# Patient Record
Sex: Male | Born: 1945 | Race: White | Hispanic: No | Marital: Single | Smoking: Never smoker
Health system: Southern US, Community
[De-identification: ages and names within clinical notes are randomized; demographics above are authoritative.]

## PROBLEM LIST (undated history)

## (undated) DIAGNOSIS — M24661 Ankylosis, right knee: Secondary | ICD-10-CM

## (undated) DIAGNOSIS — M1990 Unspecified osteoarthritis, unspecified site: Secondary | ICD-10-CM

## (undated) DIAGNOSIS — I10 Essential (primary) hypertension: Secondary | ICD-10-CM

## (undated) DIAGNOSIS — C801 Malignant (primary) neoplasm, unspecified: Secondary | ICD-10-CM

## (undated) DIAGNOSIS — R0981 Nasal congestion: Secondary | ICD-10-CM

## (undated) HISTORY — PX: KNEE ARTHROSCOPY: SUR90

---

## 2016-08-03 ENCOUNTER — Ambulatory Visit (INDEPENDENT_AMBULATORY_CARE_PROVIDER_SITE_OTHER): Payer: Worker's Compensation | Admitting: Orthopaedic Surgery

## 2016-08-03 DIAGNOSIS — M1712 Unilateral primary osteoarthritis, left knee: Secondary | ICD-10-CM

## 2016-08-03 DIAGNOSIS — G8929 Other chronic pain: Secondary | ICD-10-CM | POA: Insufficient documentation

## 2016-08-03 DIAGNOSIS — M25561 Pain in right knee: Secondary | ICD-10-CM | POA: Diagnosis not present

## 2016-08-03 MED ORDER — METHYLPREDNISOLONE ACETATE 40 MG/ML IJ SUSP
40.0000 mg | INTRAMUSCULAR | Status: AC | PRN
  Administered 2016-08-03: 40 mg via INTRA_ARTICULAR

## 2016-08-03 MED ORDER — LIDOCAINE HCL 1 % IJ SOLN
3.0000 mL | INTRAMUSCULAR | Status: AC | PRN
  Administered 2016-08-03: 3 mL

## 2016-08-03 NOTE — Progress Notes (Signed)
   Office Visit Note   Patient: Joseph Craig           Date of Birth: Apr 10, 1946           MRN: [id number] Visit Date: 08/03/2016              Requested by: No referring provider defined for this encounter. PCP: No PCP Per Patient   Assessment & Plan: Visit Diagnoses:  1. Chronic pain of right knee   2. Unilateral primary osteoarthritis, left knee     Plan: He tolerated the steroid injection well and is right knee. I'll see him back in a month and see if we can consider hyaluronic acid again after that point. 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  Follow-Up Instructions: Return in about 4 weeks (around 08/31/2016).   Orders:  No orders of the defined types were placed in this encounter.  No orders of the defined types were placed in this encounter.     Procedures: Large Joint Inj Date/Time: 08/03/2016 3:59 PM Performed by: Mcarthur Rossetti Authorized by: Mcarthur Rossetti   Location:  Knee Site:  R knee Ultrasound Guidance: No   Fluoroscopic Guidance: No   Arthrogram: No   Medications:  3 mL lidocaine 1 %; 40 mg methylPREDNISolone acetate 40 MG/ML     Clinical Data: No additional findings.   Subjective: No chief complaint on file. The patient is very pleasant 71 year old well known to me. We follow him routinely for a work-related injury to his right knee. He had a twisting injury that knee and sustained a meniscal tear but he also has known pre-existing arthritis in that knee. His injury did aggravate a pre-existing condition. He has done well with supplement hyaluronic acid injections in his knee. We last in 15 months ago Lattie Haw best done great however treatment started to wear off file to consider steroid injection is needed today.    HPI  Review of Systems   Objective: Vital Signs: There were no vitals taken for this visit.  Physical Exam He is alert and oriented 3 in no  acute distress and walks with just a slight limp Ortho Exam His right knee is ligamentously stable. It has a mild varus malalignment. Good range of motion but definitely hurts on the medial joint line. Specialty Comments:  No specialty comments available.  Imaging: No results found.   PMFS History: Patient Active Problem List   Diagnosis Date Noted  . Unilateral primary osteoarthritis, left knee 08/03/2016  . Chronic pain of right knee 08/03/2016   No past medical history on file.  No family history on file.  No past surgical history on file. Social History   Occupational History  . Not on file.   Social History Main Topics  . Smoking status: Not on file  . Smokeless tobacco: Not on file  . Alcohol use Not on file  . Drug use: Unknown  . Sexual activity: Not on file

## 2016-08-31 ENCOUNTER — Telehealth (INDEPENDENT_AMBULATORY_CARE_PROVIDER_SITE_OTHER): Payer: Self-pay

## 2016-08-31 ENCOUNTER — Ambulatory Visit (INDEPENDENT_AMBULATORY_CARE_PROVIDER_SITE_OTHER): Payer: Worker's Compensation | Admitting: Orthopaedic Surgery

## 2016-08-31 DIAGNOSIS — M25561 Pain in right knee: Secondary | ICD-10-CM

## 2016-08-31 DIAGNOSIS — G8929 Other chronic pain: Secondary | ICD-10-CM

## 2016-08-31 DIAGNOSIS — M1712 Unilateral primary osteoarthritis, left knee: Secondary | ICD-10-CM

## 2016-08-31 NOTE — Telephone Encounter (Signed)
Is there anyway we can have a STAT request for Monovisc injection?

## 2016-08-31 NOTE — Progress Notes (Signed)
The patient has known arthritic changes in his right knee that flared up after work-related accident. He is here today for scheduled hyaluronic acid injection in his right knee. That's what's helped him the most he states. I still feel at some point he would end up with an arthroscopic intervention or even a knee replacement lower try to still temporize things and that's what he wants as well. We have ordered the Monvisc injection for his right knee and is here for it today. He is doing well otherwise other than chronic right knee pain. He said the last steroid injection didn't help much.  On examination of his right knee has a slight varus deformity with good range of motion but is just painful to him.  He tolerated the hyaluronic acid injection well. We'll see him back in about 3 months to see how is doing overall. My next step with him would be an arthroscopic intervention.

## 2016-08-31 NOTE — Telephone Encounter (Signed)
lvm for adj Joseph Craig to return my call regarding auth

## 2016-09-23 ENCOUNTER — Telehealth (INDEPENDENT_AMBULATORY_CARE_PROVIDER_SITE_OTHER): Payer: Self-pay

## 2016-09-23 NOTE — Telephone Encounter (Signed)
Faxed the 08/31/16 and 08/03/16 office notes to him per his request [phone]

## 2016-10-14 ENCOUNTER — Ambulatory Visit (INDEPENDENT_AMBULATORY_CARE_PROVIDER_SITE_OTHER): Payer: Worker's Compensation | Admitting: Physician Assistant

## 2016-10-14 DIAGNOSIS — M1711 Unilateral primary osteoarthritis, right knee: Secondary | ICD-10-CM | POA: Diagnosis not present

## 2016-10-14 MED ORDER — METHYLPREDNISOLONE ACETATE 40 MG/ML IJ SUSP
40.0000 mg | INTRAMUSCULAR | Status: AC | PRN
  Administered 2016-10-14: 40 mg via INTRA_ARTICULAR

## 2016-10-14 MED ORDER — LIDOCAINE HCL 1 % IJ SOLN
3.0000 mL | INTRAMUSCULAR | Status: AC | PRN
  Administered 2016-10-14: 3 mL

## 2016-10-14 NOTE — Progress Notes (Signed)
   Procedure Note [name] comes in today requesting a cortisone injection in his right knee. He states that the Monovisc injection has not helped. Monovisc injection given on 08/31/2016. He is having pain walking up and down stairs. No new injury.   Patient: Joseph Craig             Date of Birth: [birth year]           MRN: [id number]             Visit Date: 10/14/2016  Procedures: Visit Diagnoses: Primary osteoarthritis of right knee  Large Joint Inj Date/Time: 10/14/2016 11:25 AM Performed by: Pete Pelt Authorized by: Pete Pelt   Location:  Knee Site:  R knee Needle Size:  22 G Needle Length:  1.5 inches and 3.5 inches Approach:  Anterolateral Ultrasound Guidance: No   Fluoroscopic Guidance: No   Arthrogram: No   Medications:  3 mL lidocaine 1 %; 40 mg methylPREDNISolone acetate 40 MG/ML Aspiration Attempted: No   Patient tolerance:  Patient tolerated the procedure well with no immediate complications   Plan: They did have slight effusion of the knee today I suggested aspiration he defers. He will follow with Dr. Ninfa Linden as scheduled on June 21.

## 2016-12-01 ENCOUNTER — Encounter (INDEPENDENT_AMBULATORY_CARE_PROVIDER_SITE_OTHER): Payer: Self-pay | Admitting: Orthopaedic Surgery

## 2016-12-01 ENCOUNTER — Ambulatory Visit (INDEPENDENT_AMBULATORY_CARE_PROVIDER_SITE_OTHER): Payer: Worker's Compensation | Admitting: Orthopaedic Surgery

## 2016-12-01 DIAGNOSIS — G8929 Other chronic pain: Secondary | ICD-10-CM | POA: Diagnosis not present

## 2016-12-01 DIAGNOSIS — M25561 Pain in right knee: Secondary | ICD-10-CM | POA: Diagnosis not present

## 2016-12-01 DIAGNOSIS — M1711 Unilateral primary osteoarthritis, right knee: Secondary | ICD-10-CM | POA: Diagnosis not present

## 2016-12-01 DIAGNOSIS — M23203 Derangement of unspecified medial meniscus due to old tear or injury, right knee: Secondary | ICD-10-CM | POA: Diagnosis not present

## 2016-12-01 MED ORDER — MELOXICAM 7.5 MG PO TABS: 7.5000 mg | ORAL_TABLET | Freq: Two times a day (BID) | ORAL | 3 refills | Status: DC | PRN

## 2016-12-01 NOTE — Progress Notes (Signed)
The patient is well-known to me. He has been followed for quite some time now for a medial meniscal tear that occurred after twisting injury in a work-related accident. He's had multiple injections of steroid and hyaluronic acid over a year now. He's been on anti-inflammatories as well and is working activity modification, physical therapy with quad exercises, he is on anti-inflammatory medications, and he's work on weight loss. At this point his pain is still daily. He has locking catching in his right knee on the medial compartment. He has known medial compartment arthritis but this was definitely a twisting injury that caused; he was pain free until until this twisting injury caused the meniscal tear.  On exam he still has mild effusion of his right knee. His mild varus malalignment and definitely positive McMurray sign to the medial side. At this point is tried and failed all forms conservative treatment he does wish to have an arthroscopic intervention of this right knee. I agree with this at this point. He is tried and failed all forms conservative treatment for well over a year now. I do believe that this is medically and surgically warranted. All questions are encouraged and answered all of the risk and benefits of the surgery. 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

## 2016-12-13 ENCOUNTER — Telehealth (INDEPENDENT_AMBULATORY_CARE_PROVIDER_SITE_OTHER): Payer: Self-pay | Admitting: Orthopaedic Surgery

## 2016-12-13 NOTE — Telephone Encounter (Signed)
I faxed 12/01/2016 ov note to Latay the [facility] @ [place] [phone]. Ph [phone]

## 2016-12-21 ENCOUNTER — Telehealth (INDEPENDENT_AMBULATORY_CARE_PROVIDER_SITE_OTHER): Payer: Self-pay

## 2016-12-21 NOTE — Telephone Encounter (Signed)
Faxed the last office note to adj per his request

## 2017-01-26 ENCOUNTER — Encounter: Payer: Self-pay | Admitting: Orthopaedic Surgery

## 2017-01-26 DIAGNOSIS — M1711 Unilateral primary osteoarthritis, right knee: Secondary | ICD-10-CM | POA: Diagnosis not present

## 2017-01-26 DIAGNOSIS — M23221 Derangement of posterior horn of medial meniscus due to old tear or injury, right knee: Secondary | ICD-10-CM | POA: Diagnosis not present

## 2017-02-02 ENCOUNTER — Ambulatory Visit (INDEPENDENT_AMBULATORY_CARE_PROVIDER_SITE_OTHER): Payer: Worker's Compensation | Admitting: Orthopaedic Surgery

## 2017-02-02 DIAGNOSIS — Z9889 Other specified postprocedural states: Secondary | ICD-10-CM | POA: Insufficient documentation

## 2017-02-02 DIAGNOSIS — M23203 Derangement of unspecified medial meniscus due to old tear or injury, right knee: Secondary | ICD-10-CM

## 2017-02-02 NOTE — Progress Notes (Signed)
Joseph Craig is now one-week status post a right knee arthroscopy. We found complex medial meniscal tear and end-stage arthritis the medial compartment of his knee. He does understand them arthritis is been there and likely with a meniscal tearing that was work-related is accelerated some of his arthritis as well. He says he is getting around well. He tolerated the surgery well. He does have some knee swelling and is been worn Ace wrap around his knee.  On exam the suture line looks good and sutures been removed. He does have some moderate swelling of the knee but good range of motion overall. We talked in detail about the surgery involves and what his knees, but like in the future. Actually made of the knee replacement.  At this point I will let him return to work September 4 but avoid climbing ladders until further notice. We'll try a knee sleeve for his knee as well. We'll see him back in a month to see how is doing overall.

## 2017-02-08 ENCOUNTER — Telehealth (INDEPENDENT_AMBULATORY_CARE_PROVIDER_SITE_OTHER): Payer: Self-pay

## 2017-02-08 NOTE — Telephone Encounter (Signed)
Faxed the 02/02/17 office note to adj per his request

## 2017-02-20 ENCOUNTER — Telehealth (INDEPENDENT_AMBULATORY_CARE_PROVIDER_SITE_OTHER): Payer: Self-pay | Admitting: Orthopaedic Surgery

## 2017-02-20 NOTE — Telephone Encounter (Signed)
Joseph Craig from [facility] called asking for the patient injection order for his knee to be faxed over to [phone]. Thank you.

## 2017-02-21 NOTE — Telephone Encounter (Signed)
I am confused, I don't believe I have an order to order an injection for him

## 2017-02-22 NOTE — Telephone Encounter (Signed)
Faxed the last office note and work note to adj and advised we are not doing an injection at this time

## 2017-03-02 ENCOUNTER — Ambulatory Visit (INDEPENDENT_AMBULATORY_CARE_PROVIDER_SITE_OTHER): Payer: Worker's Compensation | Admitting: Orthopaedic Surgery

## 2017-03-02 DIAGNOSIS — M25561 Pain in right knee: Secondary | ICD-10-CM

## 2017-03-02 DIAGNOSIS — Z9889 Other specified postprocedural states: Secondary | ICD-10-CM

## 2017-03-02 MED ORDER — METHYLPREDNISOLONE ACETATE 40 MG/ML IJ SUSP
40.0000 mg | INTRAMUSCULAR | Status: AC | PRN
  Administered 2017-03-02: 40 mg via INTRA_ARTICULAR

## 2017-03-02 MED ORDER — LIDOCAINE HCL 1 % IJ SOLN
3.0000 mL | INTRAMUSCULAR | Status: AC | PRN
  Administered 2017-03-02: 3 mL

## 2017-03-02 NOTE — Progress Notes (Signed)
   Procedure Note  Patient: Erikson Danzy             Date of Birth: 03-10-1946           MRN: [id number]             Visit Date: 03/02/2017  Procedures: Visit Diagnoses: Status post arthroscopy of right knee  Large Joint Inj Date/Time: 03/02/2017 3:15 PM Performed by: Mcarthur Rossetti Authorized by: Mcarthur Rossetti   Location:  Knee Site:  R knee Ultrasound Guidance: No   Fluoroscopic Guidance: No   Arthrogram: No   Medications:  3 mL lidocaine 1 %; 40 mg methylPREDNISolone acetate 40 MG/ML   [name] is now about a month out from a knee arthroscopy. We found significant where the cartilage in the medial compartment of his knee at some point is likely need a knee replacement due to the large deficits of cartilage throughout the medial compartment. Is basically bone-on-bone wear. He had a work-related acute meniscal tear to that knee and we performed arthroscopic partial medial meniscectomy. His at multiple injections in that knee but not since surgery. He's had hyaluronic acid as well. Worsening in today and postoperative recommending a steroid injection just to calm down the postoperative information and see if this will help ease the pain on his knee. On exam he does have a varus malalignment of the knee and a medial joint line tenderness. He tolerated the steroid injection well. We'll see him back in about 3 months to see how this is done for him. No x-rays are needed.

## 2017-03-22 ENCOUNTER — Telehealth (INDEPENDENT_AMBULATORY_CARE_PROVIDER_SITE_OTHER): Payer: Self-pay

## 2017-03-22 NOTE — Telephone Encounter (Signed)
Faxed the 03/02/17 office note to [facility] per his request

## 2017-05-29 ENCOUNTER — Telehealth (INDEPENDENT_AMBULATORY_CARE_PROVIDER_SITE_OTHER): Payer: Self-pay | Admitting: Orthopaedic Surgery

## 2017-05-29 NOTE — Telephone Encounter (Signed)
Patient called and stated that he has appt on 12/20 and WC patient and thinks he need another injection and wants to know if that it should be pre-approved prior to his appt-please call patient to advise

## 2017-05-29 NOTE — Telephone Encounter (Signed)
No Josem Kaufmann is needed, he can make an appointment whenever he's able to get another injection

## 2017-05-29 NOTE — Telephone Encounter (Signed)
No auth needed.

## 2017-05-29 NOTE — Telephone Encounter (Signed)
Plain cortisone injections don't have to be approved for WC, correct?

## 2017-06-01 ENCOUNTER — Ambulatory Visit (INDEPENDENT_AMBULATORY_CARE_PROVIDER_SITE_OTHER): Payer: Worker's Compensation | Admitting: Orthopaedic Surgery

## 2017-06-01 ENCOUNTER — Encounter (INDEPENDENT_AMBULATORY_CARE_PROVIDER_SITE_OTHER): Payer: Self-pay | Admitting: Orthopaedic Surgery

## 2017-06-01 DIAGNOSIS — M1711 Unilateral primary osteoarthritis, right knee: Secondary | ICD-10-CM | POA: Diagnosis not present

## 2017-06-01 MED ORDER — LIDOCAINE HCL 1 % IJ SOLN
3.0000 mL | INTRAMUSCULAR | Status: AC | PRN
  Administered 2017-06-01: 3 mL

## 2017-06-01 MED ORDER — METHYLPREDNISOLONE ACETATE 40 MG/ML IJ SUSP
40.0000 mg | INTRAMUSCULAR | Status: AC | PRN
  Administered 2017-06-01: 40 mg via INTRA_ARTICULAR

## 2017-06-01 NOTE — Progress Notes (Signed)
   Procedure Note  Patient: Joseph Craig             Date of Birth: 26-Feb-1946           MRN: [id number]             Visit Date: 06/01/2017  Procedures: Visit Diagnoses: Primary osteoarthritis of right knee  Large Joint Inj: R knee on 06/01/2017 3:04 PM Indications: diagnostic evaluation and pain Details: 22 G 1.5 in needle, superolateral approach  Arthrogram: No  Medications: 3 mL lidocaine 1 %; 40 mg methylPREDNISolone acetate 40 MG/ML Outcome: tolerated well, no immediate complications Procedure, treatment alternatives, risks and benefits explained, specific risks discussed. Consent was given by the patient. Immediately prior to procedure a time out was called to verify the correct patient, procedure, equipment, support staff and site/side marked as required. Patient was prepped and draped in the usual sterile fashion.

## 2017-06-01 NOTE — Progress Notes (Signed)
The patient has known osteoarthritis and degenerative joint disease in his right knee.  Some of this was pre-existing prior to a severe meniscal tear from a work-related accident.  I do feel that this accelerated the arthritis in his knee after we had to perform a partial medial meniscectomy.  He is coming in today hoping to have a steroid injection in his knee.  He last had one just over 3 months ago.  He has had hyaluronic acid in the past like to consider that again.  On examination of his right knee there is no significant effusion but there is patellofemoral crepitation and varus malalignment and significant medial joint line tenderness as well as patellofemoral crepitation.  I did not mind placing a steroid injection back in his knee today in the tolerated well.  We will order hyaluronic acid would like the Synvisc 1 to put in his knee in a month from now.  All questions concerns were answered and addressed.

## 2017-06-08 ENCOUNTER — Telehealth (INDEPENDENT_AMBULATORY_CARE_PROVIDER_SITE_OTHER): Payer: Self-pay

## 2017-06-08 NOTE — Telephone Encounter (Signed)
-----   Message from Anise Salvo, [place] sent at 06/08/2017  9:56 AM EST ----- I need a Monovisc injection  in the right knee for this patient, remind me how we go about this with WC?

## 2017-06-08 NOTE — Telephone Encounter (Signed)
Faxed the 06/01/17 office note to adj. Left vm adv that note was faxed and to let me know if the monovisc injection is approved.

## 2017-06-08 NOTE — Telephone Encounter (Signed)
Faxed the 06/01/17 office note to the [facility] per his request

## 2017-06-29 ENCOUNTER — Ambulatory Visit (INDEPENDENT_AMBULATORY_CARE_PROVIDER_SITE_OTHER): Payer: Worker's Compensation | Admitting: Orthopaedic Surgery

## 2017-06-29 ENCOUNTER — Encounter (INDEPENDENT_AMBULATORY_CARE_PROVIDER_SITE_OTHER): Payer: Self-pay | Admitting: Orthopaedic Surgery

## 2017-06-29 DIAGNOSIS — M1711 Unilateral primary osteoarthritis, right knee: Secondary | ICD-10-CM | POA: Diagnosis not present

## 2017-06-29 MED ORDER — HYLAN G-F 20 48 MG/6ML IX SOSY
48.0000 mg | PREFILLED_SYRINGE | INTRA_ARTICULAR | Status: AC | PRN
  Administered 2017-06-29: 48 mg via INTRA_ARTICULAR

## 2017-06-29 NOTE — Progress Notes (Signed)
   Procedure Note  Patient: Joseph Craig             Date of Birth: July 16, 1945           MRN: [id number]             Visit Date: 06/29/2017  Procedures: Visit Diagnoses: Unilateral primary osteoarthritis, right knee  Large Joint Inj: R knee on 06/29/2017 3:31 PM Indications: pain and diagnostic evaluation Details: 22 G 1.5 in needle, superolateral approach  Arthrogram: No  Medications: 48 mg Hylan 48 MG/6ML Outcome: tolerated well, no immediate complications Procedure, treatment alternatives, risks and benefits explained, specific risks discussed. Consent was given by the patient. Immediately prior to procedure a time out was called to verify the correct patient, procedure, equipment, support staff and site/side marked as required. Patient was prepped and draped in the usual sterile fashion.    [name] is here today to have a scheduled Synvisc 1 injection in his right knee to treat moderate to severe arthritic pain.  There is been no other change in his medical status.  He has had hyaluronic acid before and has tolerated it well.  Examination of his right knee does show varus malalignment and significant medial joint line tenderness with full range of motion.  He tolerated the injection well.  He will follow-up as needed knowing that he can always have another injection in 6 months with hyaluronic acid or steroid injection in the interim if needed in 3 months.

## 2017-07-11 ENCOUNTER — Other Ambulatory Visit (INDEPENDENT_AMBULATORY_CARE_PROVIDER_SITE_OTHER): Payer: Self-pay

## 2017-07-11 MED ORDER — MELOXICAM 7.5 MG PO TABS: 7.5000 mg | ORAL_TABLET | Freq: Two times a day (BID) | ORAL | 0 refills | Status: DC | PRN

## 2017-07-17 ENCOUNTER — Telehealth (INDEPENDENT_AMBULATORY_CARE_PROVIDER_SITE_OTHER): Payer: Self-pay

## 2017-07-17 NOTE — Telephone Encounter (Signed)
Faxed the 06/29/17 office note to [facility] per his request [phone]

## 2017-09-11 ENCOUNTER — Encounter (INDEPENDENT_AMBULATORY_CARE_PROVIDER_SITE_OTHER): Payer: Self-pay | Admitting: Orthopaedic Surgery

## 2017-09-11 ENCOUNTER — Ambulatory Visit (INDEPENDENT_AMBULATORY_CARE_PROVIDER_SITE_OTHER): Payer: Worker's Compensation | Admitting: Orthopaedic Surgery

## 2017-09-11 DIAGNOSIS — M25561 Pain in right knee: Secondary | ICD-10-CM | POA: Diagnosis not present

## 2017-09-11 DIAGNOSIS — M1711 Unilateral primary osteoarthritis, right knee: Secondary | ICD-10-CM | POA: Diagnosis not present

## 2017-09-11 DIAGNOSIS — G8929 Other chronic pain: Secondary | ICD-10-CM | POA: Diagnosis not present

## 2017-09-11 MED ORDER — LIDOCAINE HCL 1 % IJ SOLN
3.0000 mL | INTRAMUSCULAR | Status: AC | PRN
  Administered 2017-09-11: 3 mL

## 2017-09-11 MED ORDER — METHYLPREDNISOLONE ACETATE 40 MG/ML IJ SUSP
40.0000 mg | INTRAMUSCULAR | Status: AC | PRN
  Administered 2017-09-11: 40 mg via INTRA_ARTICULAR

## 2017-09-11 NOTE — Progress Notes (Signed)
   Procedure Note  Patient: Joseph Craig             Date of Birth: Nov 08, 1945           MRN: [id number]             Visit Date: 09/11/2017  Procedures: Visit Diagnoses: Unilateral primary osteoarthritis, right knee  Chronic pain of right knee  Large Joint Inj: R knee on 09/11/2017 3:07 PM Indications: diagnostic evaluation and pain Details: 22 G 1.5 in needle, superolateral approach  Arthrogram: No  Medications: 3 mL lidocaine 1 %; 40 mg methylPREDNISolone acetate 40 MG/ML Outcome: tolerated well, no immediate complications Procedure, treatment alternatives, risks and benefits explained, specific risks discussed. Consent was given by the patient. Immediately prior to procedure a time out was called to verify the correct patient, procedure, equipment, support staff and site/side marked as required. Patient was prepped and draped in the usual sterile fashion.    Patient is here today to have a steroid injection when his right knee.  He has known significant arthritis in that right knee.  He is tried all forms conservative treatment and at some point does wish to consider knee replacement surgery.  He still wants to put this off since steroid injections help.  He last had hyaluronic acid injection with Synvisc one back in January but he says has not really helped much.  On examination does have swelling and pain in his knee mainly in the medial compartment.  He tolerated steroid injection well.  We can repeat this again in 3-4 months if needed.

## 2017-12-18 ENCOUNTER — Ambulatory Visit (INDEPENDENT_AMBULATORY_CARE_PROVIDER_SITE_OTHER): Payer: Worker's Compensation | Admitting: Orthopaedic Surgery

## 2017-12-18 ENCOUNTER — Encounter (INDEPENDENT_AMBULATORY_CARE_PROVIDER_SITE_OTHER): Payer: Self-pay | Admitting: Orthopaedic Surgery

## 2017-12-18 DIAGNOSIS — M1711 Unilateral primary osteoarthritis, right knee: Secondary | ICD-10-CM

## 2017-12-18 MED ORDER — METHYLPREDNISOLONE ACETATE 40 MG/ML IJ SUSP
40.0000 mg | INTRAMUSCULAR | Status: AC | PRN
Start: 2017-12-18 — End: 2017-12-18
  Administered 2017-12-18: 40 mg via INTRA_ARTICULAR

## 2017-12-18 MED ORDER — LIDOCAINE HCL 1 % IJ SOLN
3.0000 mL | INTRAMUSCULAR | Status: AC | PRN
  Administered 2017-12-18: 3 mL

## 2017-12-18 NOTE — Progress Notes (Signed)
Office Visit Note   Patient: Joseph Craig           Date of Birth: 03-Oct-1945           MRN: [id number] Visit Date: 12/18/2017              Requested by: No referring provider defined for this encounter. PCP: Patient, No Pcp Per   Assessment & Plan: Visit Diagnoses:  1. Unilateral primary osteoarthritis, right knee     Plan: He understands that he does need to see his primary care physician at some point to check his blood pressure he does have venous stasis disease in both his legs and he does have compressive garments at home but he is to be more diligent wearing.  He understands this as well.  All question concerns were answered and addressed.  He will come back and see [place] in 3 months.  Follow-Up Instructions: Return in about 3 months (around 03/20/2018).   Orders:  Orders Placed This Encounter  Procedures  . Large Joint Inj   No orders of the defined types were placed in this encounter.     Procedures: Large Joint Inj: R knee on 12/18/2017 1:10 PM Indications: diagnostic evaluation and pain Details: 22 G 1.5 in needle, superolateral approach  Arthrogram: No  Medications: 3 mL lidocaine 1 %; 40 mg methylPREDNISolone acetate 40 MG/ML Outcome: tolerated well, no immediate complications Procedure, treatment alternatives, risks and benefits explained, specific risks discussed. Consent was given by the patient. Immediately prior to procedure a time out was called to verify the correct patient, procedure, equipment, support staff and site/side marked as required. Patient was prepped and draped in the usual sterile fashion.       Clinical Data: No additional findings.   Subjective: Chief Complaint  Patient presents with  . Right Knee - Follow-up  Patient is here for continued follow-up of known osteoarthritis of his right knee.  He would like to have a steroid injection today.  He said no acute changes in medical status otherwise according to him.  HPI  Review of  Systems He denies any shortness of breath, he denies any fever, chills, nausea, vomiting.  Objective: Vital Signs: There were no vitals taken for this visit.  Physical Exam He is alert and oriented x3 in no acute distress Ortho Exam Examination of his right knee does show varus malalignment medial joint line tenderness and patellofemoral crepitation.  He does have bilateral lower extremity venous stasis changes.  I agreed with trying a steroid shot in his knee again today since this would help some the most.  However Specialty Comments:  No specialty comments available.  Imaging: No results found.   PMFS History: Patient Active Problem List   Diagnosis Date Noted  . Unilateral primary osteoarthritis, right knee 06/29/2017  . Status post arthroscopy of right knee 02/02/2017  . 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 Primary osteoarthritis of right knee 10/14/2016  . Unilateral primary osteoarthritis, left knee 08/03/2016  . Chronic pain of right knee 08/03/2016   No past medical history on file.  No family history on file.  No past surgical history on file. Social History   Occupational History  . Not on file  Tobacco Use  . Smoking status: Not on file  Substance and Sexual Activity  . Alcohol use: Not on file  . Drug use: Not on file  . Sexual activity: Not on file

## 2017-12-25 ENCOUNTER — Ambulatory Visit (INDEPENDENT_AMBULATORY_CARE_PROVIDER_SITE_OTHER): Payer: Self-pay | Admitting: Orthopaedic Surgery

## 2018-03-15 ENCOUNTER — Ambulatory Visit (INDEPENDENT_AMBULATORY_CARE_PROVIDER_SITE_OTHER): Payer: Worker's Compensation | Admitting: Physician Assistant

## 2018-03-15 ENCOUNTER — Encounter (INDEPENDENT_AMBULATORY_CARE_PROVIDER_SITE_OTHER): Payer: Self-pay | Admitting: Physician Assistant

## 2018-03-15 DIAGNOSIS — M1711 Unilateral primary osteoarthritis, right knee: Secondary | ICD-10-CM | POA: Diagnosis not present

## 2018-03-15 MED ORDER — METHYLPREDNISOLONE ACETATE 40 MG/ML IJ SUSP
40.0000 mg | INTRAMUSCULAR | Status: AC | PRN
  Administered 2018-03-15: 40 mg via INTRA_ARTICULAR

## 2018-03-15 MED ORDER — LIDOCAINE HCL 1 % IJ SOLN
3.0000 mL | INTRAMUSCULAR | Status: AC | PRN
  Administered 2018-03-15: 3 mL

## 2018-03-15 NOTE — Progress Notes (Signed)
   Procedure Note  Patient: Joseph Craig             Date of Birth: 23-Jan-1946           MRN: [id number]             Visit Date: 03/15/2018  HPI: [name] returns today wanting a repeat injection in his right knee.  His last seen in July and had an injection.  He said no new injury.  He notes some swelling of the knee.  No fevers or chills.  He has known osteoarthritis of his knee  Physical exam: Right knee full extension flexion to approximately 110 degrees.  No instability valgus varus stressing.  Positive effusion no abnormal warmth erythema or ecchymosis. Procedures: Visit Diagnoses: No diagnosis found.  Large Joint Inj on 03/15/2018 2:13 PM Indications: pain Details: 22 G 1.5 in needle, superolateral approach  Arthrogram: No  Medications: 3 mL lidocaine 1 %; 40 mg methylPREDNISolone acetate 40 MG/ML Aspirate: 28 mL serous Outcome: tolerated well, no immediate complications Procedure, treatment alternatives, risks and benefits explained, specific risks discussed. Consent was given by the patient. Immediately prior to procedure a time out was called to verify the correct patient, procedure, equipment, support staff and site/side marked as required. Patient was prepped and draped in the usual sterile fashion.     Plan: He will follow-up on an as-needed basis.  He understands to wait 3 months between injections.  Questions were encouraged and answered .

## 2018-03-19 ENCOUNTER — Ambulatory Visit (INDEPENDENT_AMBULATORY_CARE_PROVIDER_SITE_OTHER): Payer: Self-pay | Admitting: Orthopaedic Surgery

## 2018-04-12 ENCOUNTER — Other Ambulatory Visit (INDEPENDENT_AMBULATORY_CARE_PROVIDER_SITE_OTHER): Payer: Self-pay | Admitting: Orthopaedic Surgery

## 2018-06-18 ENCOUNTER — Telehealth (INDEPENDENT_AMBULATORY_CARE_PROVIDER_SITE_OTHER): Payer: Self-pay | Admitting: Orthopaedic Surgery

## 2018-06-18 NOTE — Telephone Encounter (Signed)
Yes - that would be fine

## 2018-06-18 NOTE — Telephone Encounter (Signed)
Please advise. Last injection 03/15/18.

## 2018-06-18 NOTE — Telephone Encounter (Signed)
call

## 2018-06-18 NOTE — Telephone Encounter (Signed)
Patient called asked if he can get another cortisone injection in his right knee. The number to contact patient is [phone]

## 2018-06-19 NOTE — Telephone Encounter (Signed)
Called and lm on vm to advise that this would be ok. To call with any other questions.

## 2018-06-25 ENCOUNTER — Encounter (INDEPENDENT_AMBULATORY_CARE_PROVIDER_SITE_OTHER): Payer: Self-pay | Admitting: Orthopaedic Surgery

## 2018-06-25 ENCOUNTER — Ambulatory Visit (INDEPENDENT_AMBULATORY_CARE_PROVIDER_SITE_OTHER): Payer: Worker's Compensation | Admitting: Orthopaedic Surgery

## 2018-06-25 DIAGNOSIS — M25561 Pain in right knee: Secondary | ICD-10-CM

## 2018-06-25 DIAGNOSIS — M1711 Unilateral primary osteoarthritis, right knee: Secondary | ICD-10-CM

## 2018-06-25 DIAGNOSIS — G8929 Other chronic pain: Secondary | ICD-10-CM

## 2018-06-25 MED ORDER — METHYLPREDNISOLONE ACETATE 40 MG/ML IJ SUSP
40.0000 mg | INTRAMUSCULAR | Status: AC | PRN
  Administered 2018-06-25: 40 mg via INTRA_ARTICULAR

## 2018-06-25 MED ORDER — LIDOCAINE HCL 1 % IJ SOLN
3.0000 mL | INTRAMUSCULAR | Status: AC | PRN
  Administered 2018-06-25: 3 mL

## 2018-06-25 NOTE — Progress Notes (Signed)
Office Visit Note   Patient: Joseph Craig           Date of Birth: 08-10-1945           MRN: [id number] Visit Date: 06/25/2018              Requested by: No referring provider defined for this encounter. PCP: Patient, No Pcp Per   Assessment & Plan: Visit Diagnoses:  1. Chronic pain of right knee   2. Unilateral primary osteoarthritis, right knee     Plan: I was able to aspirate 40 to 50 cc of fluid from his right knee and then place a steroid injection in it.  I do feel this point he is tried and failed all forms conservative treatment and a knee replacement is warranted based on the significant arthritis in his knee as well as the detrimental effect that is had on his mobility, his quality of life and his activities of daily living.  He actually has a full physical at the [place] at the end of this month.  We will see him back in 4 weeks now to see how he is doing overall.  At that visit I would like a standing AP and lateral of his right knee.  I would also like to have him wait and get a height and weight so we can calculate his BMI.  Follow-Up Instructions: Return in about 4 weeks (around 07/23/2018).   Orders:  Orders Placed This Encounter  Procedures  . Large Joint Inj   No orders of the defined types were placed in this encounter.     Procedures: Large Joint Inj: R knee on 06/25/2018 5:03 PM Indications: diagnostic evaluation and pain Details: 22 G 1.5 in needle, superolateral approach  Arthrogram: No  Medications: 3 mL lidocaine 1 %; 40 mg methylPREDNISolone acetate 40 MG/ML Outcome: tolerated well, no immediate complications Procedure, treatment alternatives, risks and benefits explained, specific risks discussed. Consent was given by the patient. Immediately prior to procedure a time out was called to verify the correct patient, procedure, equipment, support staff and site/side marked as required. Patient was prepped and draped in the usual sterile fashion.        Clinical Data: No additional findings.   Subjective: Chief Complaint  Patient presents with  . Right Knee - Follow-up  The patient is well-known to me.  He has significant medial compartment arthritis and patellofemoral joint arthritis in his right knee.  He originally injured this knee on the job.  At that point he had had no knee issues but had significant meniscal tear.  We were able to eventually perform arthroscopic surgery on the knee and were able to take care of the medial meniscal tear with a partial medial meniscectomy we did find areas of significant cartilage loss in his knee.  Some of this was pre-existing but certainly now having while some of the shock observer capacity of his knee it is accelerated a pre-existing process.  He comes in the office today with acute on chronic right knee pain.  He is hoping for an aspiration and steroid injection.  He is already had sterile steroid injections and hyaluronic acid in that right knee.  At this point I feel that he is a candidate for knee replacement surgery.  HPI  Review of Systems He currently denies any headache, chest pain, shortness of breath, fever, chills, nausea, vomiting  Objective: Vital Signs: There were no vitals taken for this visit.  Physical Exam He  is alert and orient x3 and in no acute distress Ortho Exam Examination of his right knee shows that it is slightly warm.  There is no redness.  There is a moderate effusion.  He has painful range of motion the knee.  There is varus malalignment and mainly medial joint line tenderness with patellofemoral crepitation. Specialty Comments:  No specialty comments available.  Imaging: No results found.   PMFS History: Patient Active Problem List   Diagnosis Date Noted  . Unilateral primary osteoarthritis, right knee 06/29/2017  . Status post arthroscopy of right knee 02/02/2017  . 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 Primary  osteoarthritis of right knee 10/14/2016  . Unilateral primary osteoarthritis, left knee 08/03/2016  . Chronic pain of right knee 08/03/2016   History reviewed. No pertinent past medical history.  History reviewed. No pertinent family history.  History reviewed. No pertinent surgical history. Social History   Occupational History  . Not on file  Tobacco Use  . Smoking status: Not on file  Substance and Sexual Activity  . Alcohol use: Not on file  . Drug use: Not on file  . Sexual activity: Not on file

## 2018-07-23 ENCOUNTER — Encounter (INDEPENDENT_AMBULATORY_CARE_PROVIDER_SITE_OTHER): Payer: Self-pay | Admitting: Orthopaedic Surgery

## 2018-07-23 ENCOUNTER — Ambulatory Visit (INDEPENDENT_AMBULATORY_CARE_PROVIDER_SITE_OTHER): Payer: Worker's Compensation

## 2018-07-23 ENCOUNTER — Ambulatory Visit (INDEPENDENT_AMBULATORY_CARE_PROVIDER_SITE_OTHER): Payer: Worker's Compensation | Admitting: Orthopaedic Surgery

## 2018-07-23 DIAGNOSIS — M1711 Unilateral primary osteoarthritis, right knee: Secondary | ICD-10-CM | POA: Diagnosis not present

## 2018-07-23 NOTE — Progress Notes (Signed)
The patient is a very active and pleasant 73 year old gentleman that I been seeing since 2015 when he sustained a work-related injury to his right knee.  He had never had knee issues before that.  His left knee has always been normal.  The right knee did sustain a significant meniscal tear of the medial meniscus.  There was already some arthritic changes in the knee as well.  After the very conservative treatment we taken to the operating room for an arthroscopic intervention of that knee.  We had to perform a partial medial meniscectomy.  Over time he has been having worsening knee pain with worsening of his medial compartment arthritic changes.  He has had numerous injections of steroid in his right knee over time.  He is also had numerous hyaluronic acid injections.  At this point his right knee pain is detrimentally affecting his mobility, his quality of life and his activities of daily living.  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  All this can still be traced back to his original work injury due to the meniscal tear in the right knee and the fact that he had not had issues before then.  At today's visit I did obtain new x-rays of his right knee.  There is complete loss of joint space at this point on the medial compartment of his knee and varus malalignment.  This is essentially bone-on-bone wear and end-stage arthritis.  At this point he does wish to proceed with knee replacement surgery.  I agree with this.  I do feel that this is directly related to his previous work-related injury and as a result of worsening changes in his knees.  We showed him a knee model before and explained in detail what the surgery involves.  We talked about the risk and benefits of knee replacement surgery as well as what his intraoperative and postoperative course were involved.  I went over his x-rays with him in detail.  We talked about surgery in general.  His hemoglobin A1c is 6.2.  At this  point he is interested in having surgery scheduled we will get it scheduled but he understands he needs Worker's Comp. approval as well.  Once we have the schedule we will give him a call.  All question concerns were answered and addressed.

## 2018-07-26 ENCOUNTER — Telehealth (INDEPENDENT_AMBULATORY_CARE_PROVIDER_SITE_OTHER): Payer: Self-pay

## 2018-07-26 NOTE — Telephone Encounter (Signed)
Faxed surgery request and last office note to wc adj Latage @ [employer] [phone]

## 2018-08-08 ENCOUNTER — Telehealth (INDEPENDENT_AMBULATORY_CARE_PROVIDER_SITE_OTHER): Payer: Self-pay | Admitting: Orthopaedic Surgery

## 2018-08-08 NOTE — Telephone Encounter (Signed)
Patient called and wanted to know status on him having surgery. He was told that you had to look into [facility] first to see if they would approve this.  Please call patient to advise. [phone]

## 2018-08-15 NOTE — Telephone Encounter (Signed)
Left message for Joseph Craig with employer to call me back  [phone]

## 2018-08-15 NOTE — Telephone Encounter (Signed)
Pt is aware we are still waiting on work comp [place]. He says Desmond Dike is still handling claim but carrier has changed. He will call me back with the name and phone # for his contact with his employer so I can call to get the correct info.

## 2018-08-15 NOTE — Telephone Encounter (Signed)
Spoke with Jalene Mullet with pts employer. Carrier has changed to [place] and is no longer [employer] but Desmond Dike adj with [employer] is now with [place] and still handling the claim. She will have to call me back with his updated contact info.

## 2018-08-21 NOTE — Telephone Encounter (Signed)
Per employer, Desmond Dike is with [place] now and his phone # [phone]. Left a message for him to call me back to discuss.

## 2018-08-28 ENCOUNTER — Telehealth (INDEPENDENT_AMBULATORY_CARE_PROVIDER_SITE_OTHER): Payer: Self-pay

## 2018-08-28 NOTE — Telephone Encounter (Signed)
See below

## 2018-08-28 NOTE — Telephone Encounter (Signed)
Work comp has not approved pts knee replacement yet. Just found out today that they are sending him for a 2nd opinion which has not been set up yet. Called pt to advise.He was questioning if he could get another cortisone injection or if it was too soon? Advised him I would ask the doctor but we would have to get this approved by work comp as well which might take some time. In the mean time he is requesting an out of work note. Did not specify for how long he wanted it for. He said that his knee has just been bothering him a lot lately but normally if he can be off of it for awhile it gets some better. He wants this faxed to his employer but didn't know the number. Said he would call me back with it or have his employer call me with it.

## 2018-08-28 NOTE — Telephone Encounter (Signed)
Spoke with Zannie Cove with employer and she gave me the new wc billing infor for [place]. She was told adj is Karalee Height. Emailed her to verify if she was handling claim [email or web address] [phone]

## 2018-08-28 NOTE — Telephone Encounter (Signed)
Received emailed back from [facility] stating they are sending pt for a 2nd opinion

## 2018-08-29 NOTE — Telephone Encounter (Signed)
We can keep him out of work for now just let him know that I would recommend wording it that he is out of work until 6 to 8 weeks after surgery bare minimum.  In regards to cortisone injection he has an injection now he will need to wait 2 to 3 months to have the knee surgery due to increased risk of infection with a cortisone injection.

## 2018-08-31 NOTE — Telephone Encounter (Signed)
Left patient a detailed voicemail to call us back to let us know for how long he wants the work note for and who to fax it to. Adv about the cortisone injection as well.

## 2018-09-06 ENCOUNTER — Telehealth (INDEPENDENT_AMBULATORY_CARE_PROVIDER_SITE_OTHER): Payer: Self-pay

## 2018-09-06 ENCOUNTER — Telehealth (INDEPENDENT_AMBULATORY_CARE_PROVIDER_SITE_OTHER): Payer: Self-pay | Admitting: Orthopaedic Surgery

## 2018-09-06 NOTE — Telephone Encounter (Signed)
Adjustor left me vm wanting to introduce herself and give her contact info. Aware surgery can't be scheduled at this time and we will call pt to schedule him once we are ok'd to do so with the hospital.

## 2018-09-06 NOTE — Telephone Encounter (Signed)
Can we put her in next week for or next available for steroid injection

## 2018-09-06 NOTE — Telephone Encounter (Signed)
See below

## 2018-09-06 NOTE — Telephone Encounter (Signed)
We can put in another steroid inject in a week.

## 2018-09-06 NOTE — Telephone Encounter (Signed)
Patient is scheduled 09/11/18 at 10:45am with Dr Ninfa Linden

## 2018-09-06 NOTE — Telephone Encounter (Signed)
I called patient to advise that worker's comp has approved the surgery but due to COVID 19 I am not able to schedule the surgery right now.  I will call him when I am able to schedule.  He questioned what might he do in the meantime for the symptoms.  His last cortisone injection was 06/25/18.  Please advise him. [phone]

## 2018-09-10 ENCOUNTER — Telehealth (INDEPENDENT_AMBULATORY_CARE_PROVIDER_SITE_OTHER): Payer: Self-pay | Admitting: *Deleted

## 2018-09-10 NOTE — Telephone Encounter (Signed)
Prescreened pt for COVID 19 for appt scheduled 09/11/2018 and pt answered NO to all questions 

## 2018-09-11 ENCOUNTER — Ambulatory Visit (INDEPENDENT_AMBULATORY_CARE_PROVIDER_SITE_OTHER): Payer: Worker's Compensation | Admitting: Orthopaedic Surgery

## 2018-09-11 ENCOUNTER — Other Ambulatory Visit: Payer: Self-pay

## 2018-09-11 ENCOUNTER — Encounter (INDEPENDENT_AMBULATORY_CARE_PROVIDER_SITE_OTHER): Payer: Self-pay | Admitting: Orthopaedic Surgery

## 2018-09-11 DIAGNOSIS — M1711 Unilateral primary osteoarthritis, right knee: Secondary | ICD-10-CM

## 2018-09-11 MED ORDER — LIDOCAINE HCL 1 % IJ SOLN
3.0000 mL | INTRAMUSCULAR | Status: AC | PRN
  Administered 2018-09-11: 3 mL

## 2018-09-11 MED ORDER — METHYLPREDNISOLONE ACETATE 40 MG/ML IJ SUSP
40.0000 mg | INTRAMUSCULAR | Status: AC | PRN
  Administered 2018-09-11: 40 mg via INTRA_ARTICULAR

## 2018-09-11 NOTE — Progress Notes (Signed)
   Office Visit Note   Patient: Joseph Craig           Date of Birth: August 29, 1945           MRN: [id number] Visit Date: 09/11/2018              Requested by: No referring provider defined for this encounter. PCP: Patient, No Pcp Per   Assessment & Plan: Visit Diagnoses:  1. Unilateral primary osteoarthritis, right knee     Plan: We will see him back in June hopefully be able to proceed with total knee replacement sometime thereafter.  Follow-Up Instructions: Return in about 2 months (around 11/21/2018).   Orders:  Orders Placed This Encounter  Procedures  . Large Joint Inj   No orders of the defined types were placed in this encounter.     Procedures: Large Joint Inj: R knee on 09/11/2018 11:15 AM Indications: pain Details: 22 G 1.5 in needle, anterolateral approach  Arthrogram: No  Medications: 3 mL lidocaine 1 %; 40 mg methylPREDNISolone acetate 40 MG/ML Aspirate: 40 mL yellow Outcome: tolerated well, no immediate complications Procedure, treatment alternatives, risks and benefits explained, specific risks discussed. Consent was given by the patient. Immediately prior to procedure a time out was called to verify the correct patient, procedure, equipment, support staff and site/side marked as required. Patient was prepped and draped in the usual sterile fashion.       Clinical Data: No additional findings.   Subjective: Chief Complaint  Patient presents with  . Right Knee - Follow-up    HPI Joseph Craig comes in today for cortisone injection in his right knee.  He was set up for right total knee replacement but due to current condition with the COVID-19 virus we are unable to proceed with surgery.  He is looking for anything to help with his pain.  He has had no new injury.  Review of Systems See HPI  Objective: Vital Signs: There were no vitals taken for this visit.  Physical Exam Constitutional:      Appearance: He is not ill-appearing or diaphoretic.   Pulmonary:     Effort: Pulmonary effort is normal.  Neurological:     Mental Status: He is alert and oriented to person, place, and time.     Ortho Exam Right knee positive effusion no abnormal warmth erythema.  Painful range of motion of the knee. Specialty Comments:  No specialty comments available.  Imaging: No results found.   PMFS History: Patient Active Problem List   Diagnosis Date Noted  . Unilateral primary osteoarthritis, right knee 06/29/2017  . Status post arthroscopy of right knee 02/02/2017  . 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 Primary osteoarthritis of right knee 10/14/2016  . Unilateral primary osteoarthritis, left knee 08/03/2016  . Chronic pain of right knee 08/03/2016   History reviewed. No pertinent past medical history.  History reviewed. No pertinent family history.  History reviewed. No pertinent surgical history. Social History   Occupational History  . Not on file  Tobacco Use  . Smoking status: Not on file  Substance and Sexual Activity  . Alcohol use: Not on file  . Drug use: Not on file  . Sexual activity: Not on file

## 2018-10-30 ENCOUNTER — Encounter: Payer: Self-pay | Admitting: Orthopaedic Surgery

## 2018-10-31 ENCOUNTER — Other Ambulatory Visit: Payer: Self-pay | Admitting: Physician Assistant

## 2018-10-31 NOTE — Progress Notes (Signed)
Please place orders in Epic as patient is being scheduled for a pre-op appointment! Thank you! 

## 2018-11-07 NOTE — Progress Notes (Signed)
SPOKE W/  _     SCREENING SYMPTOMS OF COVID 19:   COUGH--No  RUNNY NOSE--- No  SORE THROAT---No  NASAL CONGESTION----No  SNEEZING----No  SHORTNESS OF BREATH---No  DIFFICULTY BREATHING---No  TEMP >100.0 ----No  UNEXPLAINED BODY ACHES------No  CHILLS -------- No  HEADACHES ---------No  LOSS OF SMELL/ TASTE -------No    HAVE YOU OR ANY FAMILY MEMBER TRAVELLED PAST 14 DAYS OUT OF THE   COUNTY---No STATE----No COUNTRY---No-  HAVE YOU OR ANY FAMILY MEMBER BEEN EXPOSED TO ANYONE WITH COVID 19? No

## 2018-11-07 NOTE — Patient Instructions (Addendum)
Joseph Craig  11/07/2018   Your procedure is scheduled on: 11-16-18    Report to [facility] Main  Entrance    Report to Admitting at 6:00 AM   YOU NEED TO HAVE A COVID 19 TEST ON 11-13-18 , THIS TEST MUST BE DONE BEFORE SURGERY, COME TO [facility] ENTRANCE BETWEEN THE HOURS OF 900 AM AND 300 PM ON YOUR COVID TEST DATE.   Call this number if you have problems the morning of surgery [phone]    Remember: NO SOLID FOOD AFTER MIDNIGHT THE NIGHT PRIOR TO SURGERY. NOTHING BY MOUTH EXCEPT CLEAR LIQUIDS UNTIL 3 HOURS PRIOR TO SCHEDULED SURGERY. PLEASE FINISH ENSURE DRINK PER SURGEON ORDER 3 HOURS PRIOR TO SCHEDULED SURGERY TIME WHICH NEEDS TO BE COMPLETED AT 4:30 AM.   CLEAR LIQUID DIET   Foods Allowed                                                                     Foods Excluded  Coffee and tea, regular and decaf                             liquids that you cannot  Plain Jell-O in any flavor                                             see through such as: Fruit ices (not with fruit pulp)                                     milk, soups, orange juice  Iced Popsicles                                    All solid food Carbonated beverages, regular and diet                                    Cranberry, grape and apple juices Sports drinks like Gatorade Lightly seasoned clear broth or consume(fat free) Sugar, honey syrup  Sample Menu Breakfast                                Lunch                                     Supper Cranberry juice                    Beef broth                            Chicken broth Jell-O  Grape juice                           Apple juice Coffee or tea                        Jell-O                                      Popsicle                                                Coffee or tea                        Coffee or  tea  _____________________________________________________________________      BRUSH YOUR TEETH MORNING OF SURGERY AND RINSE YOUR MOUTH OUT, NO CHEWING GUM CANDY OR MINTS.     Take these medicines the morning of surgery with A SIP OF WATER: Amlodipine (Norvasc). You can also use and bring your nasal spray as needed                                You may not have any metal on your body including hair pins and              piercings     Do not wear jewelry,cologne lotions, powders or deodorant                         Men may shave face and neck.   Do not bring valuables to the hospital. [place].  Contacts, dentures or bridgework may not be worn into surgery.     Special Instructions: N/A              Please read over the following fact sheets you were given: _____________________________________________________________________             [facility] - Preparing for Surgery Before surgery, you can play an important role.  Because skin is not sterile, your skin needs to be as free of germs as possible.  You can reduce the number of germs on your skin by washing with CHG (chlorahexidine gluconate) soap before surgery.  CHG is an antiseptic cleaner which kills germs and bonds with the skin to continue killing germs even after washing. Please DO NOT use if you have an allergy to CHG or antibacterial soaps.  If your skin becomes reddened/irritated stop using the CHG and inform your nurse when you arrive at Short Stay. Do not shave (including legs and underarms) for at least 48 hours prior to the first CHG shower.  You may shave your face/neck. Please follow these instructions carefully:  1.  Shower with CHG Soap the night before surgery and the  morning of Surgery.  2.  If you choose to wash your hair, wash your hair first as usual with your  normal  shampoo.  3.  After you shampoo, rinse your hair and body thoroughly to remove the  shampoo.                           4.  Use CHG as you would any other liquid soap.  You can apply chg directly  to the skin and wash                       Gently with a scrungie or clean washcloth.  5.  Apply the CHG Soap to your body ONLY FROM THE NECK DOWN.   Do not use on face/ open                           Wound or open sores. Avoid contact with eyes, ears mouth and genitals (private parts).                       Wash face,  Genitals (private parts) with your normal soap.             6.  Wash thoroughly, paying special attention to the area where your surgery  will be performed.  7.  Thoroughly rinse your body with warm water from the neck down.  8.  DO NOT shower/wash with your normal soap after using and rinsing off  the CHG Soap.                9.  Pat yourself dry with a clean towel.            10.  Wear clean pajamas.            11.  Place clean sheets on your bed the night of your first shower and do not  sleep with pets. Day of Surgery : Do not apply any lotions/deodorants the morning of surgery.  Please wear clean clothes to the hospital/surgery center.  FAILURE TO FOLLOW THESE INSTRUCTIONS MAY RESULT IN THE CANCELLATION OF YOUR SURGERY PATIENT SIGNATURE_________________________________  NURSE SIGNATURE__________________________________  ________________________________________________________________________   Joseph Craig  An incentive spirometer is a tool that can help keep your lungs clear and active. This tool measures how well you are filling your lungs with each breath. Taking long deep breaths may help reverse or decrease the chance of developing breathing (pulmonary) problems (especially infection) following:  A long period of time when you are unable to move or be active. BEFORE THE PROCEDURE   If the spirometer includes an indicator to show your best effort, your nurse or respiratory therapist will set it to a desired goal.  If possible, sit up straight  or lean slightly forward. Try not to slouch.  Hold the incentive spirometer in an upright position. INSTRUCTIONS FOR USE  1. Sit on the edge of your bed if possible, or sit up as far as you can in bed or on a chair. 2. Hold the incentive spirometer in an upright position. 3. Breathe out normally. 4. Place the mouthpiece in your mouth and seal your lips tightly around it. 5. Breathe in slowly and as deeply as possible, raising the piston or the ball toward the top of the column. 6. Hold your breath for 3-5 seconds or for as long as possible. Allow the piston or ball to fall to the bottom of the column. 7. Remove the mouthpiece from your mouth and breathe out normally. 8. Rest for a few seconds and repeat Steps 1 through 7 at least 10 times every  1-2 hours when you are awake. Take your time and take a few normal breaths between deep breaths. 9. The spirometer may include an indicator to show your best effort. Use the indicator as a goal to work toward during each repetition. 10. After each set of 10 deep breaths, practice coughing to be sure your lungs are clear. If you have an incision (the cut made at the time of surgery), support your incision when coughing by placing a pillow or rolled up towels firmly against it. Once you are able to get out of bed, walk around indoors and cough well. You may stop using the incentive spirometer when instructed by your caregiver.  RISKS AND COMPLICATIONS  Take your time so you do not get dizzy or light-headed.  If you are in pain, you may need to take or ask for pain medication before doing incentive spirometry. It is harder to take a deep breath if you are having pain. AFTER USE  Rest and breathe slowly and easily.  It can be helpful to keep track of a log of your progress. Your caregiver can provide you with a simple table to help with this. If you are using the spirometer at home, follow these instructions: [place] IF:   You are having  difficultly using the spirometer.  You have trouble using the spirometer as often as instructed.  Your pain medication is not giving enough relief while using the spirometer.  You develop fever of 100.5 F (38.1 C) or higher. SEEK IMMEDIATE MEDICAL CARE IF:   You cough up bloody sputum that had not been present before.  You develop fever of 102 F (38.9 C) or greater.  You develop worsening pain at or near the incision site. MAKE SURE YOU:   Understand these instructions.  Will watch your condition.  Will get help right away if you are not doing well or get worse. Document Released: 10/10/2006 Document Revised: 08/22/2011 Document Reviewed: 12/11/2006 [facility] Patient Information 2014 [place], [place].   ________________________________________________________________________

## 2018-11-08 ENCOUNTER — Encounter (HOSPITAL_COMMUNITY)
Admission: RE | Admit: 2018-11-08 | Discharge: 2018-11-08 | Disposition: A | Discharge status: Home / Self Care | Payer: No Typology Code available for payment source | Source: Ambulatory Visit | Attending: Orthopaedic Surgery | Admitting: Orthopaedic Surgery

## 2018-11-08 ENCOUNTER — Other Ambulatory Visit: Payer: Self-pay

## 2018-11-08 ENCOUNTER — Encounter (HOSPITAL_COMMUNITY): Payer: Self-pay

## 2018-11-08 DIAGNOSIS — Z01818 Encounter for other preprocedural examination: Secondary | ICD-10-CM | POA: Insufficient documentation

## 2018-11-08 DIAGNOSIS — M1711 Unilateral primary osteoarthritis, right knee: Secondary | ICD-10-CM | POA: Diagnosis not present

## 2018-11-08 DIAGNOSIS — I10 Essential (primary) hypertension: Secondary | ICD-10-CM | POA: Diagnosis not present

## 2018-11-08 HISTORY — DX: Unspecified osteoarthritis, unspecified site: M19.90

## 2018-11-08 HISTORY — DX: Essential (primary) hypertension: I10

## 2018-11-08 HISTORY — DX: Malignant (primary) neoplasm, unspecified: C80.1

## 2018-11-08 HISTORY — DX: Nasal congestion: R09.81

## 2018-11-08 LAB — CBC
HCT: 43.7 % (ref 39.0–52.0)
Hemoglobin: 14.9 g/dL (ref 13.0–17.0)
MCH: 32.8 pg (ref 26.0–34.0)
MCHC: 34.1 g/dL (ref 30.0–36.0)
MCV: 96.3 fL (ref 80.0–100.0)
Platelets: 267 10*3/uL (ref 150–400)
RBC: 4.54 MIL/uL (ref 4.22–5.81)
RDW: 12.7 % (ref 11.5–15.5)
WBC: 7.4 10*3/uL (ref 4.0–10.5)
nRBC: 0 % (ref 0.0–0.2)

## 2018-11-08 LAB — BASIC METABOLIC PANEL
Anion gap: 10 (ref 5–15)
BUN: 14 mg/dL (ref 8–23)
CO2: 27 mmol/L (ref 22–32)
Calcium: 9.1 mg/dL (ref 8.9–10.3)
Chloride: 98 mmol/L (ref 98–111)
Creatinine, Ser: 0.86 mg/dL (ref 0.61–1.24)
GFR calc Af Amer: >60 mL/min (ref >60)
GFR calc non Af Amer: >60 mL/min (ref >60)
Glucose, Bld: 108 mg/dL — ABNORMAL HIGH (ref 70–99)
Potassium: 4.6 mmol/L (ref 3.5–5.1)
Sodium: 135 mmol/L (ref 135–145)

## 2018-11-08 LAB — SURGICAL PCR SCREEN
MRSA, PCR: NEGATIVE
Staphylococcus aureus: NEGATIVE

## 2018-11-08 NOTE — Progress Notes (Signed)
   11/08/18 0912  OBSTRUCTIVE SLEEP APNEA  Have you ever been diagnosed with sleep apnea through a sleep study? No  Do you snore loudly (loud enough to be heard through closed doors)?  0  Do you often feel tired, fatigued, or sleepy during the daytime (such as falling asleep during driving or talking to someone)? 0  Has anyone observed you stop breathing during your sleep? 0  Do you have, or are you being treated for high blood pressure? 1  BMI more than 35 kg/m2? 1  Age > 50 (1-yes) 1  Neck circumference greater than:Male 16 inches or larger, Male 17inches or larger? 1  Male Gender (Yes=1) 1  Obstructive Sleep Apnea Score 5

## 2018-11-08 NOTE — Progress Notes (Signed)
No PCP on file. Therefore, unable to route to provider for follow-up

## 2018-11-13 ENCOUNTER — Other Ambulatory Visit (HOSPITAL_COMMUNITY)
Admission: RE | Admit: 2018-11-13 | Discharge: 2018-11-13 | Disposition: A | Discharge status: Home / Self Care | Payer: No Typology Code available for payment source | Source: Ambulatory Visit | Attending: Orthopaedic Surgery | Admitting: Orthopaedic Surgery

## 2018-11-13 DIAGNOSIS — Z1159 Encounter for screening for other viral diseases: Secondary | ICD-10-CM | POA: Insufficient documentation

## 2018-11-14 LAB — NOVEL CORONAVIRUS, NAA (HOSP ORDER, SEND-OUT TO REF LAB; TAT 18-24 HRS): SARS-CoV-2, NAA: NOT DETECTED

## 2018-11-15 NOTE — Progress Notes (Signed)
SPOKE W/  _  [place] 19:   COUGH--No  RUNNY NOSE---No  SORE THROAT---No  NASAL CONGESTION----No  SNEEZING----No  SHORTNESS OF BREATH---No  DIFFICULTY BREATHING---No  TEMP >100.0 -----No  UNEXPLAINED BODY ACHES------No  CHILLS --------No  HEADACHES ---------No  LOSS OF SMELL/ TASTE --------No    HAVE YOU OR ANY FAMILY MEMBER TRAVELLED PAST 14 DAYS OUT OF THE   COUNTY---NO STATE----NO COUNTRY----NO  HAVE YOU OR ANY FAMILY MEMBER BEEN EXPOSED TO ANYONE WITH COVID 19?  NO

## 2018-11-16 ENCOUNTER — Encounter (HOSPITAL_COMMUNITY): Payer: Self-pay | Admitting: *Deleted

## 2018-11-16 ENCOUNTER — Other Ambulatory Visit: Payer: Self-pay

## 2018-11-16 ENCOUNTER — Inpatient Hospital Stay (HOSPITAL_COMMUNITY): Payer: No Typology Code available for payment source | Admitting: Physician Assistant

## 2018-11-16 ENCOUNTER — Inpatient Hospital Stay (HOSPITAL_COMMUNITY)
Admission: RE | Admit: 2018-11-16 | Discharge: 2018-11-22 | DRG: 470 | Disposition: A | Discharge status: Home Health | Payer: No Typology Code available for payment source | Source: Other Acute Inpatient Hospital | Attending: Orthopaedic Surgery | Admitting: Orthopaedic Surgery

## 2018-11-16 ENCOUNTER — Observation Stay (HOSPITAL_COMMUNITY): Payer: No Typology Code available for payment source

## 2018-11-16 ENCOUNTER — Encounter (HOSPITAL_COMMUNITY)
Admission: RE | Disposition: A | Discharge status: Home Health | Payer: Self-pay | Source: Other Acute Inpatient Hospital | Attending: Orthopaedic Surgery

## 2018-11-16 ENCOUNTER — Inpatient Hospital Stay (HOSPITAL_COMMUNITY): Payer: No Typology Code available for payment source | Admitting: Anesthesiology

## 2018-11-16 ENCOUNTER — Other Ambulatory Visit (INDEPENDENT_AMBULATORY_CARE_PROVIDER_SITE_OTHER): Payer: Self-pay | Admitting: Orthopaedic Surgery

## 2018-11-16 DIAGNOSIS — Z880 Allergy status to penicillin: Secondary | ICD-10-CM | POA: Diagnosis not present

## 2018-11-16 DIAGNOSIS — I10 Essential (primary) hypertension: Secondary | ICD-10-CM | POA: Diagnosis present

## 2018-11-16 DIAGNOSIS — Z6841 Body Mass Index (BMI) 40.0 and over, adult: Secondary | ICD-10-CM

## 2018-11-16 DIAGNOSIS — N365 Urethral false passage: Secondary | ICD-10-CM | POA: Diagnosis not present

## 2018-11-16 DIAGNOSIS — M25761 Osteophyte, right knee: Secondary | ICD-10-CM | POA: Diagnosis not present

## 2018-11-16 DIAGNOSIS — Z96651 Presence of right artificial knee joint: Secondary | ICD-10-CM

## 2018-11-16 DIAGNOSIS — M1711 Unilateral primary osteoarthritis, right knee: Secondary | ICD-10-CM | POA: Diagnosis present

## 2018-11-16 DIAGNOSIS — G4730 Sleep apnea, unspecified: Secondary | ICD-10-CM | POA: Diagnosis not present

## 2018-11-16 DIAGNOSIS — K567 Ileus, unspecified: Secondary | ICD-10-CM | POA: Diagnosis not present

## 2018-11-16 DIAGNOSIS — M170 Bilateral primary osteoarthritis of knee: Principal | ICD-10-CM | POA: Diagnosis present

## 2018-11-16 DIAGNOSIS — N35811 Other urethral stricture, male, meatal: Secondary | ICD-10-CM | POA: Diagnosis present

## 2018-11-16 DIAGNOSIS — M25561 Pain in right knee: Secondary | ICD-10-CM | POA: Diagnosis present

## 2018-11-16 HISTORY — PX: CYSTOSCOPY: SHX5120

## 2018-11-16 HISTORY — PX: TOTAL KNEE ARTHROPLASTY: SHX125

## 2018-11-16 SURGERY — ARTHROPLASTY, KNEE, TOTAL
Anesthesia: Spinal | Site: Urethra | Laterality: Right

## 2018-11-16 MED ORDER — ONDANSETRON HCL 4 MG/2ML IJ SOLN
INTRAMUSCULAR | Status: DC | PRN
  Administered 2018-11-16: 4 mg via INTRAVENOUS

## 2018-11-16 MED ORDER — TAMSULOSIN HCL 0.4 MG PO CAPS
0.4000 mg | ORAL_CAPSULE | Freq: Every day | ORAL | Status: DC
  Administered 2018-11-16 – 2018-11-21 (×6): 0.4 mg via ORAL
  Filled 2018-11-16 (×6): qty 1

## 2018-11-16 MED ORDER — ONDANSETRON HCL 4 MG PO TABS: 4.0000 mg | ORAL_TABLET | Freq: Four times a day (QID) | ORAL | Status: DC | PRN

## 2018-11-16 MED ORDER — ACETAMINOPHEN 500 MG PO TABS
1000.0000 mg | ORAL_TABLET | Freq: Once | ORAL | Status: AC
  Administered 2018-11-16: 1000 mg via ORAL
  Filled 2018-11-16: qty 2

## 2018-11-16 MED ORDER — CEFAZOLIN SODIUM-DEXTROSE 2-4 GM/100ML-% IV SOLN: 2.0000 g | INTRAVENOUS | Status: DC

## 2018-11-16 MED ORDER — TRANEXAMIC ACID-NACL 1000-0.7 MG/100ML-% IV SOLN
1000.0000 mg | INTRAVENOUS | Status: AC
  Administered 2018-11-16: 1000 mg via INTRAVENOUS
  Filled 2018-11-16: qty 100

## 2018-11-16 MED ORDER — ALUM & MAG HYDROXIDE-SIMETH 200-200-20 MG/5ML PO SUSP: 30.0000 mL | ORAL | Status: DC | PRN

## 2018-11-16 MED ORDER — LACTATED RINGERS IV SOLN
INTRAVENOUS | Status: DC
  Administered 2018-11-16: 07:00:00 via INTRAVENOUS

## 2018-11-16 MED ORDER — HYDROMORPHONE HCL 1 MG/ML IJ SOLN
0.5000 mg | INTRAMUSCULAR | Status: DC | PRN
  Filled 2018-11-16: qty 1

## 2018-11-16 MED ORDER — CLINDAMYCIN PHOSPHATE 900 MG/50ML IV SOLN
INTRAVENOUS | Status: AC
  Filled 2018-11-16: qty 50

## 2018-11-16 MED ORDER — PANTOPRAZOLE SODIUM 40 MG PO TBEC
40.0000 mg | DELAYED_RELEASE_TABLET | Freq: Every day | ORAL | Status: DC
  Administered 2018-11-16 – 2018-11-22 (×7): 40 mg via ORAL
  Filled 2018-11-16 (×7): qty 1

## 2018-11-16 MED ORDER — DEXAMETHASONE SODIUM PHOSPHATE 10 MG/ML IJ SOLN
INTRAMUSCULAR | Status: AC
  Filled 2018-11-16: qty 1

## 2018-11-16 MED ORDER — ACETAMINOPHEN 325 MG PO TABS
325.0000 mg | ORAL_TABLET | Freq: Four times a day (QID) | ORAL | Status: DC | PRN
  Administered 2018-11-17 – 2018-11-22 (×5): 650 mg via ORAL
  Filled 2018-11-16 (×5): qty 2

## 2018-11-16 MED ORDER — DEXAMETHASONE SODIUM PHOSPHATE 10 MG/ML IJ SOLN
INTRAMUSCULAR | Status: DC | PRN
  Administered 2018-11-16: 10 mg via INTRAVENOUS

## 2018-11-16 MED ORDER — PROPOFOL 500 MG/50ML IV EMUL
INTRAVENOUS | Status: DC | PRN
  Administered 2018-11-16: 25 ug/kg/min via INTRAVENOUS

## 2018-11-16 MED ORDER — LIDOCAINE 2% (20 MG/ML) 5 ML SYRINGE
INTRAMUSCULAR | Status: AC
  Filled 2018-11-16: qty 5

## 2018-11-16 MED ORDER — TRIAMTERENE-HCTZ 37.5-25 MG PO TABS
1.0000 | ORAL_TABLET | Freq: Every day | ORAL | Status: DC
  Administered 2018-11-17 – 2018-11-22 (×6): 1 via ORAL
  Filled 2018-11-16 (×6): qty 1

## 2018-11-16 MED ORDER — PHENOL 1.4 % MT LIQD
1.0000 | OROMUCOSAL | Status: DC | PRN
  Filled 2018-11-16: qty 177

## 2018-11-16 MED ORDER — AMLODIPINE BESYLATE 5 MG PO TABS
5.0000 mg | ORAL_TABLET | Freq: Every day | ORAL | Status: DC
  Administered 2018-11-16 – 2018-11-22 (×7): 5 mg via ORAL
  Filled 2018-11-16 (×7): qty 1

## 2018-11-16 MED ORDER — MENTHOL 3 MG MT LOZG: 1.0000 | LOZENGE | OROMUCOSAL | Status: DC | PRN

## 2018-11-16 MED ORDER — POVIDONE-IODINE 10 % EX SWAB: 2.0000 "application " | Freq: Once | CUTANEOUS | Status: DC

## 2018-11-16 MED ORDER — CLONIDINE HCL (ANALGESIA) 100 MCG/ML EP SOLN
EPIDURAL | Status: DC | PRN
  Administered 2018-11-16: 50 ug

## 2018-11-16 MED ORDER — STERILE WATER FOR IRRIGATION IR SOLN
Status: DC | PRN
  Administered 2018-11-16: 2000 mL

## 2018-11-16 MED ORDER — ONDANSETRON HCL 4 MG/2ML IJ SOLN
INTRAMUSCULAR | Status: AC
  Filled 2018-11-16: qty 2

## 2018-11-16 MED ORDER — ONDANSETRON HCL 4 MG/2ML IJ SOLN: 4.0000 mg | Freq: Once | INTRAMUSCULAR | Status: DC | PRN

## 2018-11-16 MED ORDER — EPHEDRINE 5 MG/ML INJ
INTRAVENOUS | Status: AC
  Filled 2018-11-16: qty 10

## 2018-11-16 MED ORDER — OXYCODONE HCL 5 MG PO TABS
5.0000 mg | ORAL_TABLET | ORAL | Status: DC | PRN
  Administered 2018-11-17: 10 mg via ORAL
  Administered 2018-11-17: 5 mg via ORAL
  Administered 2018-11-17: 10 mg via ORAL
  Administered 2018-11-17: 5 mg via ORAL
  Administered 2018-11-17 – 2018-11-21 (×9): 10 mg via ORAL
  Filled 2018-11-16 (×2): qty 2
  Filled 2018-11-16: qty 1
  Filled 2018-11-16 (×9): qty 2
  Filled 2018-11-16: qty 1

## 2018-11-16 MED ORDER — DOCUSATE SODIUM 100 MG PO CAPS
100.0000 mg | ORAL_CAPSULE | Freq: Two times a day (BID) | ORAL | Status: DC
  Administered 2018-11-16 – 2018-11-22 (×12): 100 mg via ORAL
  Filled 2018-11-16 (×12): qty 1

## 2018-11-16 MED ORDER — GABAPENTIN 300 MG PO CAPS
300.0000 mg | ORAL_CAPSULE | Freq: Once | ORAL | Status: AC
  Administered 2018-11-16: 300 mg via ORAL
  Filled 2018-11-16: qty 1

## 2018-11-16 MED ORDER — FLUTICASONE PROPIONATE 50 MCG/ACT NA SUSP
1.0000 | Freq: Every day | NASAL | Status: DC
  Administered 2018-11-20: 1 via NASAL
  Filled 2018-11-16: qty 16

## 2018-11-16 MED ORDER — METOCLOPRAMIDE HCL 5 MG PO TABS: 5.0000 mg | ORAL_TABLET | Freq: Three times a day (TID) | ORAL | Status: DC | PRN

## 2018-11-16 MED ORDER — DIPHENHYDRAMINE HCL 12.5 MG/5ML PO ELIX: 12.5000 mg | ORAL_SOLUTION | ORAL | Status: DC | PRN

## 2018-11-16 MED ORDER — MIDAZOLAM HCL 2 MG/2ML IJ SOLN
1.0000 mg | INTRAMUSCULAR | Status: DC
  Administered 2018-11-16: 2 mg via INTRAVENOUS
  Filled 2018-11-16: qty 2

## 2018-11-16 MED ORDER — LIDOCAINE HCL (CARDIAC) PF 100 MG/5ML IV SOSY
PREFILLED_SYRINGE | INTRAVENOUS | Status: DC | PRN
  Administered 2018-11-16: 50 mg via INTRAVENOUS

## 2018-11-16 MED ORDER — 0.9 % SODIUM CHLORIDE (POUR BTL) OPTIME
TOPICAL | Status: DC | PRN
  Administered 2018-11-16: 1000 mL

## 2018-11-16 MED ORDER — SODIUM CHLORIDE 0.9 % IR SOLN
Status: DC | PRN
  Administered 2018-11-16: 1000 mL

## 2018-11-16 MED ORDER — PROPOFOL 10 MG/ML IV BOLUS
INTRAVENOUS | Status: AC
  Filled 2018-11-16: qty 40

## 2018-11-16 MED ORDER — FENTANYL CITRATE (PF) 100 MCG/2ML IJ SOLN
50.0000 ug | INTRAMUSCULAR | Status: DC
  Administered 2018-11-16: 50 ug via INTRAVENOUS
  Filled 2018-11-16: qty 2

## 2018-11-16 MED ORDER — METOCLOPRAMIDE HCL 5 MG/ML IJ SOLN
5.0000 mg | Freq: Three times a day (TID) | INTRAMUSCULAR | Status: DC | PRN
  Administered 2018-11-20: 10 mg via INTRAVENOUS
  Filled 2018-11-16: qty 2

## 2018-11-16 MED ORDER — FENTANYL CITRATE (PF) 100 MCG/2ML IJ SOLN
INTRAMUSCULAR | Status: DC | PRN
  Administered 2018-11-16: 50 ug via INTRAVENOUS

## 2018-11-16 MED ORDER — FENTANYL CITRATE (PF) 100 MCG/2ML IJ SOLN
INTRAMUSCULAR | Status: AC
  Filled 2018-11-16: qty 2

## 2018-11-16 MED ORDER — SODIUM CHLORIDE 0.9 % IV SOLN
INTRAVENOUS | Status: DC
  Administered 2018-11-16: 13:00:00 via INTRAVENOUS

## 2018-11-16 MED ORDER — LISINOPRIL 20 MG PO TABS
40.0000 mg | ORAL_TABLET | Freq: Two times a day (BID) | ORAL | Status: DC
  Administered 2018-11-16 – 2018-11-22 (×12): 40 mg via ORAL
  Filled 2018-11-16 (×12): qty 2

## 2018-11-16 MED ORDER — MIDAZOLAM HCL 5 MG/5ML IJ SOLN
INTRAMUSCULAR | Status: DC | PRN
  Administered 2018-11-16 (×2): 1 mg via INTRAVENOUS

## 2018-11-16 MED ORDER — ONDANSETRON HCL 4 MG/2ML IJ SOLN: 4.0000 mg | Freq: Four times a day (QID) | INTRAMUSCULAR | Status: DC | PRN

## 2018-11-16 MED ORDER — BUPIVACAINE-EPINEPHRINE (PF) 0.25% -1:200000 IJ SOLN
INTRAMUSCULAR | Status: AC
  Filled 2018-11-16: qty 30

## 2018-11-16 MED ORDER — METHOCARBAMOL 500 MG IVPB - SIMPLE MED
500.0000 mg | Freq: Four times a day (QID) | INTRAVENOUS | Status: DC | PRN
  Filled 2018-11-16: qty 50

## 2018-11-16 MED ORDER — ATROPINE SULFATE 0.4 MG/ML IJ SOLN
INTRAMUSCULAR | Status: DC | PRN
  Administered 2018-11-16: 0.2 mg via INTRAVENOUS

## 2018-11-16 MED ORDER — CLINDAMYCIN PHOSPHATE 600 MG/50ML IV SOLN
600.0000 mg | Freq: Four times a day (QID) | INTRAVENOUS | Status: AC
  Administered 2018-11-16 (×2): 600 mg via INTRAVENOUS
  Filled 2018-11-16 (×2): qty 50

## 2018-11-16 MED ORDER — CHLORHEXIDINE GLUCONATE 4 % EX LIQD: 60.0000 mL | Freq: Once | CUTANEOUS | Status: DC

## 2018-11-16 MED ORDER — METHOCARBAMOL 500 MG PO TABS
500.0000 mg | ORAL_TABLET | Freq: Four times a day (QID) | ORAL | Status: DC | PRN
  Administered 2018-11-16 – 2018-11-21 (×11): 500 mg via ORAL
  Filled 2018-11-16 (×11): qty 1

## 2018-11-16 MED ORDER — ROPIVACAINE HCL 7.5 MG/ML IJ SOLN
INTRAMUSCULAR | Status: DC | PRN
  Administered 2018-11-16: 20 mL via PERINEURAL

## 2018-11-16 MED ORDER — RIVAROXABAN 10 MG PO TABS
10.0000 mg | ORAL_TABLET | Freq: Every day | ORAL | Status: DC
  Administered 2018-11-17 – 2018-11-22 (×6): 10 mg via ORAL
  Filled 2018-11-16 (×6): qty 1

## 2018-11-16 MED ORDER — BUPIVACAINE IN DEXTROSE 0.75-8.25 % IT SOLN
INTRATHECAL | Status: DC | PRN
  Administered 2018-11-16: 1.6 mL via INTRATHECAL

## 2018-11-16 MED ORDER — MIDAZOLAM HCL 2 MG/2ML IJ SOLN
INTRAMUSCULAR | Status: AC
  Filled 2018-11-16: qty 2

## 2018-11-16 MED ORDER — FENTANYL CITRATE (PF) 100 MCG/2ML IJ SOLN: 25.0000 ug | INTRAMUSCULAR | Status: DC | PRN

## 2018-11-16 MED ORDER — POLYETHYLENE GLYCOL 3350 17 G PO PACK
17.0000 g | PACK | Freq: Every day | ORAL | Status: DC | PRN
  Administered 2018-11-20: 17 g via ORAL
  Filled 2018-11-16: qty 1

## 2018-11-16 MED ORDER — EPHEDRINE SULFATE 50 MG/ML IJ SOLN
INTRAMUSCULAR | Status: DC | PRN
  Administered 2018-11-16: 5 mg via INTRAVENOUS

## 2018-11-16 MED ORDER — CLINDAMYCIN PHOSPHATE 900 MG/50ML IV SOLN
900.0000 mg | Freq: Once | INTRAVENOUS | Status: AC
  Administered 2018-11-16: 900 mg via INTRAVENOUS

## 2018-11-16 MED ORDER — BUPIVACAINE-EPINEPHRINE 0.25% -1:200000 IJ SOLN
INTRAMUSCULAR | Status: DC | PRN
  Administered 2018-11-16: 30 mL

## 2018-11-16 MED ORDER — OXYCODONE HCL 5 MG PO TABS
10.0000 mg | ORAL_TABLET | ORAL | Status: DC | PRN
  Administered 2018-11-18 (×2): 10 mg via ORAL
  Administered 2018-11-18 – 2018-11-20 (×2): 15 mg via ORAL
  Administered 2018-11-21: 10 mg via ORAL
  Filled 2018-11-16: qty 3
  Filled 2018-11-16 (×2): qty 2
  Filled 2018-11-16 (×2): qty 3

## 2018-11-16 MED ORDER — CELECOXIB 200 MG PO CAPS
200.0000 mg | ORAL_CAPSULE | Freq: Once | ORAL | Status: AC
  Administered 2018-11-16: 200 mg via ORAL
  Filled 2018-11-16: qty 1

## 2018-11-16 SURGICAL SUPPLY — 62 items
BAG ZIPLOCK 12X15 (MISCELLANEOUS) IMPLANT
BANDAGE ACE 6X5 VEL STRL LF (GAUZE/BANDAGES/DRESSINGS) ×4 IMPLANT
BANDAGE ELASTIC 6 VELCRO ST LF (GAUZE/BANDAGES/DRESSINGS) ×2 IMPLANT
BASEPLATE TIBIAL PRIMARY SZ5 (Plate) ×2 IMPLANT
BENZOIN TINCTURE PRP APPL 2/3 (GAUZE/BANDAGES/DRESSINGS) IMPLANT
BLADE SAG 18X100X1.27 (BLADE) IMPLANT
BLADE SURG SZ10 CARB STEEL (BLADE) ×8 IMPLANT
BOWL SMART MIX CTS (DISPOSABLE) IMPLANT
CATH COUDE 5CC RIBBED (CATHETERS) IMPLANT
CATH FOLEY 2W COUNCIL 5CC 16FR (CATHETERS) IMPLANT
CATH RIBBED COUDE 5CC (CATHETERS)
CATH SILASTIC FOLEY 18FRX5CC (CATHETERS) ×2 IMPLANT
CEMENT BONE SIMPLEX SPEEDSET (Cement) ×4 IMPLANT
CLOSURE WOUND 1/2 X4 (GAUZE/BANDAGES/DRESSINGS)
COVER SURGICAL LIGHT HANDLE (MISCELLANEOUS) ×4 IMPLANT
COVER WAND RF STERILE (DRAPES) IMPLANT
CUFF TOURN SGL QUICK 34 (TOURNIQUET CUFF) ×2
CUFF TRNQT CYL 34X4.125X (TOURNIQUET CUFF) ×2 IMPLANT
DECANTER SPIKE VIAL GLASS SM (MISCELLANEOUS) IMPLANT
DRAPE U-SHAPE 47X51 STRL (DRAPES) ×4 IMPLANT
DRSG PAD ABDOMINAL 8X10 ST (GAUZE/BANDAGES/DRESSINGS) ×8 IMPLANT
DURAPREP 26ML APPLICATOR (WOUND CARE) ×4 IMPLANT
ELECT REM PT RETURN 15FT ADLT (MISCELLANEOUS) ×4 IMPLANT
FEMORAL PEG DISTAL FIXATION (Orthopedic Implant) ×2 IMPLANT
FEMORAL SZ 5 (Orthopedic Implant) ×2 IMPLANT
GAUZE SPONGE 4X4 12PLY STRL (GAUZE/BANDAGES/DRESSINGS) ×4 IMPLANT
GAUZE XEROFORM 1X8 LF (GAUZE/BANDAGES/DRESSINGS) ×2 IMPLANT
GLOVE BIO SURGEON STRL SZ7.5 (GLOVE) ×4 IMPLANT
GLOVE BIOGEL PI IND STRL 8 (GLOVE) ×4 IMPLANT
GLOVE BIOGEL PI INDICATOR 8 (GLOVE) ×4
GLOVE ECLIPSE 8.0 STRL XLNG CF (GLOVE) ×4 IMPLANT
GLOVE INDICATOR 7.5 STRL GRN (GLOVE) ×4 IMPLANT
GOWN STRL REUS W/TWL XL LVL3 (GOWN DISPOSABLE) ×8 IMPLANT
GUIDEWIRE STR DUAL SENSOR (WIRE) ×2 IMPLANT
HANDPIECE INTERPULSE COAX TIP (DISPOSABLE) ×2
HOLDER FOLEY CATH W/STRAP (MISCELLANEOUS) ×2 IMPLANT
IMMOBILIZER KNEE 20 (SOFTGOODS) ×4
IMMOBILIZER KNEE 20 THIGH 36 (SOFTGOODS) ×2 IMPLANT
IMMOBILIZER KNEE 22 (SOFTGOODS) ×2 IMPLANT
INSERT TIB PS SZ5 11 KNEE (Miscellaneous) ×2 IMPLANT
KIT TURNOVER KIT A (KITS) IMPLANT
NS IRRIG 1000ML POUR BTL (IV SOLUTION) ×4 IMPLANT
PACK TOTAL KNEE CUSTOM (KITS) ×4 IMPLANT
PADDING CAST COTTON 6X4 STRL (CAST SUPPLIES) ×6 IMPLANT
PATELLA A 35MMX10MM (Knees) ×2 IMPLANT
PIN FLUTED HEDLESS FIX 3.5X1/8 (Miscellaneous) ×2 IMPLANT
PROTECTOR NERVE ULNAR (MISCELLANEOUS) ×4 IMPLANT
SET HNDPC FAN SPRY TIP SCT (DISPOSABLE) ×2 IMPLANT
SET IRRIG Y TYPE TUR BLADDER L (SET/KITS/TRAYS/PACK) ×2 IMPLANT
SET PAD KNEE POSITIONER (MISCELLANEOUS) ×4 IMPLANT
STAPLER VISISTAT 35W (STAPLE) ×2 IMPLANT
STRIP CLOSURE SKIN 1/2X4 (GAUZE/BANDAGES/DRESSINGS) IMPLANT
SUT MNCRL AB 4-0 PS2 18 (SUTURE) ×2 IMPLANT
SUT VIC AB 0 CT1 27 (SUTURE) ×2
SUT VIC AB 0 CT1 27XBRD ANTBC (SUTURE) ×2 IMPLANT
SUT VIC AB 1 CT1 36 (SUTURE) ×8 IMPLANT
SUT VIC AB 2-0 CT1 27 (SUTURE) ×4
SUT VIC AB 2-0 CT1 TAPERPNT 27 (SUTURE) ×4 IMPLANT
TRAY FOLEY MTR SLVR 16FR STAT (SET/KITS/TRAYS/PACK) ×4 IMPLANT
WATER STERILE IRR 1000ML POUR (IV SOLUTION) ×4 IMPLANT
WRAP KNEE MAXI GEL POST OP (GAUZE/BANDAGES/DRESSINGS) ×4 IMPLANT
YANKAUER SUCT BULB TIP 10FT TU (MISCELLANEOUS) ×4 IMPLANT

## 2018-11-16 NOTE — Discharge Instructions (Addendum)
Information on my medicine - XARELTO (Rivaroxaban)   Why was Xarelto prescribed for you? Xarelto was prescribed for you to reduce the risk of blood clots forming after orthopedic surgery. The medical term for these abnormal blood clots is venous thromboembolism (VTE).  What do you need to know about xarelto ? Take your Xarelto ONCE DAILY at the same time every day. You may take it either with or without food.  If you have difficulty swallowing the tablet whole, you may crush it and mix in applesauce just prior to taking your dose.  Take Xarelto exactly as prescribed by your doctor and DO NOT stop taking Xarelto without talking to the doctor who prescribed the medication.  Stopping without other VTE prevention medication to take the place of Xarelto may increase your risk of developing a clot.  After discharge, you should have regular check-up appointments with your healthcare provider that is prescribing your Xarelto.    What do you do if you miss a dose? If you miss a dose, take it as soon as you remember on the same day then continue your regularly scheduled once daily regimen the next day. Do not take two doses of Xarelto on the same day.   Important Safety Information A possible side effect of Xarelto is bleeding. You should call your healthcare provider right away if you experience any of the following: ? Bleeding from an injury or your nose that does not stop. ? Unusual colored urine (red or dark brown) or unusual colored stools (red or black). ? Unusual bruising for unknown reasons. ? A serious fall or if you hit your head (even if there is no bleeding).  Some medicines may interact with Xarelto and might increase your risk of bleeding while on Xarelto. To help avoid this, consult your healthcare provider or pharmacist prior to using any new prescription or non-prescription medications, including herbals, vitamins, non-steroidal anti-inflammatory drugs (NSAIDs) and  supplements.  This website has more information on Xarelto: [email or web address].[email or web address]   INSTRUCTIONS AFTER JOINT REPLACEMENT   o Remove items at home which could result in a fall. This includes throw rugs or furniture in walking pathways o ICE to the affected joint every three hours while awake for 30 minutes at a time, for at least the first 3-5 days, and then as needed for pain and swelling.  Continue to use ice for pain and swelling. You may notice swelling that will progress down to the foot and ankle.  This is normal after surgery.  Elevate your leg when you are not up walking on it.   o Continue to use the breathing machine you got in the hospital (incentive spirometer) which will help keep your temperature down.  It is common for your temperature to cycle up and down following surgery, especially at night when you are not up moving around and exerting yourself.  The breathing machine keeps your lungs expanded and your temperature down.   DIET:  As you were doing prior to hospitalization, we recommend a well-balanced diet.  DRESSING / WOUND CARE / SHOWERING  Keep the surgical dressing until follow up.  The dressing is water proof, so you can shower without any extra covering.  IF THE DRESSING FALLS OFF or the wound gets wet inside, change the dressing with sterile gauze.  Please use good hand washing techniques before changing the dressing.  Do not use any lotions or creams on the incision until instructed by your surgeon.    ACTIVITY  o Increase  activity slowly as tolerated, but follow the weight bearing instructions below.   o No driving for 6 weeks or until further direction given by your physician.  You cannot drive while taking narcotics.  o No lifting or carrying greater than 10 lbs. until further directed by your surgeon. o Avoid periods of inactivity such as sitting longer than an hour when not asleep. This helps prevent blood clots.  o You may return to work once you are authorized by  your doctor.     WEIGHT BEARING   Weight bearing as tolerated with assist device (walker, cane, etc) as directed, use it as long as suggested by your surgeon or therapist, typically at least 4-6 weeks.   EXERCISES  Results after joint replacement surgery are often greatly improved when you follow the exercise, range of motion and muscle strengthening exercises prescribed by your doctor. Safety measures are also important to protect the joint from further injury. Any time any of these exercises cause you to have increased pain or swelling, decrease what you are doing until you are comfortable again and then slowly increase them. If you have problems or questions, call your caregiver or physical therapist for advice.   Rehabilitation is important following a joint replacement. After just a few days of immobilization, the muscles of the leg can become weakened and shrink (atrophy).  These exercises are designed to build up the tone and strength of the thigh and leg muscles and to improve motion. Often times heat used for twenty to thirty minutes before working out will loosen up your tissues and help with improving the range of motion but do not use heat for the first two weeks following surgery (sometimes heat can increase post-operative swelling).   These exercises can be done on a training (exercise) mat, on the floor, on a table or on a bed. Use whatever works the best and is most comfortable for you.    Use music or television while you are exercising so that the exercises are a pleasant break in your day. This will make your life better with the exercises acting as a break in your routine that you can look forward to.   Perform all exercises about fifteen times, three times per day or as directed.  You should exercise both the operative leg and the other leg as well.  Exercises include:    Quad Sets - Tighten up the muscle on the front of the thigh (Quad) and hold for 5-10 seconds.     Straight Leg Raises - With your knee straight (if you were given a brace, keep it on), lift the leg to 60 degrees, hold for 3 seconds, and slowly lower the leg.  Perform this exercise against resistance later as your leg gets stronger.   Leg Slides: Lying on your back, slowly slide your foot toward your buttocks, bending your knee up off the floor (only go as far as is comfortable). Then slowly slide your foot back down until your leg is flat on the floor again.   Angel Wings: Lying on your back spread your legs to the side as far apart as you can without causing discomfort.   Hamstring Strength:  Lying on your back, push your heel against the floor with your leg straight by tightening up the muscles of your buttocks.  Repeat, but this time bend your knee to a comfortable angle, and push your heel against the floor.  You may put a pillow under the heel to  make it more comfortable if necessary.   A rehabilitation program following joint replacement surgery can speed recovery and prevent re-injury in the future due to weakened muscles. Contact your doctor or a physical therapist for more information on knee rehabilitation.    CONSTIPATION  Constipation is defined medically as fewer than three stools per week and severe constipation as less than one stool per week.  Even if you have a regular bowel pattern at home, your normal regimen is likely to be disrupted due to multiple reasons following surgery.  Combination of anesthesia, postoperative narcotics, change in appetite and fluid intake all can affect your bowels.   YOU MUST use at least one of the following options; they are listed in order of increasing strength to get the job done.  They are all available over the counter, and you may need to use some, POSSIBLY even all of these options:    Drink plenty of fluids (prune juice may be helpful) and high fiber foods Colace 100 mg by mouth twice a day  Senokot for constipation as directed and as  needed Dulcolax (bisacodyl), take with full glass of water  Miralax (polyethylene glycol) once or twice a day as needed.  If you have tried all these things and are unable to have a bowel movement in the first 3-4 days after surgery call either your surgeon or your primary doctor.    If you experience loose stools or diarrhea, hold the medications until you stool forms back up.  If your symptoms do not get better within 1 week or if they get worse, check with your doctor.  If you experience "the worst abdominal pain ever" or develop nausea or vomiting, please contact the office immediately for further recommendations for treatment.   ITCHING:  If you experience itching with your medications, try taking only a single pain pill, or even half a pain pill at a time.  You can also use Benadryl over the counter for itching or also to help with sleep.   TED HOSE STOCKINGS:  Use stockings on both legs until for at least 2 weeks or as directed by physician office. They may be removed at night for sleeping.  MEDICATIONS:  See your medication summary on the After Visit Summary that nursing will review with you.  You may have some home medications which will be placed on hold until you complete the course of blood thinner medication.  It is important for you to complete the blood thinner medication as prescribed.  PRECAUTIONS:  If you experience chest pain or shortness of breath - call 911 immediately for transfer to the hospital emergency department.   If you develop a fever greater that 101 F, purulent drainage from wound, increased redness or drainage from wound, foul odor from the wound/dressing, or calf pain - CONTACT YOUR SURGEON.                                                   FOLLOW-UP APPOINTMENTS:  If you do not already have a post-op appointment, please call the office for an appointment to be seen by your surgeon.  Guidelines for how soon to be seen are listed in your After Visit Summary, but  are typically between 1-4 weeks after surgery.  OTHER INSTRUCTIONS:   Knee Replacement:  Do not place pillow under knee,  focus on keeping the knee straight while resting. CPM instructions: 0-90 degrees, 2 hours in the morning, 2 hours in the afternoon, and 2 hours in the evening. Place foam block, curve side up under heel at all times except when in CPM or when walking.  DO NOT modify, tear, cut, or change the foam block in any way.  MAKE SURE YOU:   Understand these instructions.   Get help right away if you are not doing well or get worse.    Thank you for letting us be a part of your medical care team.  It is a privilege we respect greatly.  We hope these instructions will help you stay on track for a fast and full recovery!

## 2018-11-16 NOTE — Op Note (Signed)
Cystoscopy with Complicated Catheter Placement:  INDICATION: Pt unerdoiing major ortho surgery today with need for temporary catheter, NSG unable to safely place due to urehtral narrowing.  PROCEDURE: Pt's history reviewed via EMR. NO h/o GU surgery. He Is also questioned as under spinal. SIngle attemept 34F coude with significant resistance proximal pendulous urethral, also some meatal stneosis.  21F flexible cystoscopy performed. False pass and mild strictre noted proximal pendulous urethra. True lumen navigated to level of bladder that was mildly trabeculated, otherwise unremarkable. Prostate unremarkable. Marland Kitchen038 sensor wire placed and 21F council over this into bladder with immediate efflux yellow urine. 10cc water in balloon.  Discussed with primary team and rec keep foley in place, then office trial of void in about 7 days.

## 2018-11-16 NOTE — Consult Note (Signed)
Reason for Consult: Urethral Stricture / Meatal Stenosis  Referring Physician: Zollie Beckers MD  Joseph Craig is an 73 y.o. male.   HPI:   Urethral Stricture / Meatal Stenosis - pt with h/o meatal narrowing noted to have difficult foley as part of major ortho surgery today. Urgent intra-operative consult requested. Pt found to have mild meatal stenosis, urethral stricture (mild) and false pass, catheter placed over a wire.  Today "Joseph Craig" is seen as urgent intra-operative consult.   Past Medical History:  Diagnosis Date  . Arthritis   . Cancer ([place])    Skin-Precancerous tag removed  . Hypertension   . Sinus congestion     History reviewed. No pertinent surgical history.  History reviewed. No pertinent family history.  Social History:  reports that he has never smoked. He has never used smokeless tobacco. He reports current alcohol use of about 1.0 standard drinks of alcohol per week. He reports that he does not use drugs.  Allergies:  Allergies  Allergen Reactions  . Penicillins Anaphylaxis    Did it involve swelling of the face/tongue/throat, SOB, or low BP? Yes Did it involve sudden or severe rash/hives, skin peeling, or any reaction on the inside of your mouth or nose? No Did you need to seek medical attention at a hospital or doctor's office? Yes When did it last happen?childhood If all above answers are "NO", may proceed with cephalosporin use.     Medications: I have reviewed the patient's current medications.  No results found for this or any previous visit (from the past 48 hour(s)).  No results found.  Review of Systems  Constitutional: Negative for chills and fever.  Genitourinary: Negative for dysuria, hematuria and urgency.  All other systems reviewed and are negative.  Blood pressure 132/78, pulse (!) 47, temperature 98.6 F (37 C), temperature source Oral, resp. rate 15, height 5\' 8"  (1.727 m), weight 127 kg, SpO2 99 %. Physical Exam   Constitutional: He appears well-developed.  Under spinal in OR10  Eyes: Pupils are equal, round, and reactive to light.  Neck: Normal range of motion.  Cardiovascular: Normal rate.  By anesthesia monitor  GI: Soft.  Genitourinary:    Genitourinary Comments: UNcirc'd, with mild meatal stenosis.    Neurological: He is alert.  Skin: Skin is warm.  Psychiatric: He has a normal mood and affect.    Assessment/Plan:  Urethral Stricture / Meatal Stenosis - catheter placed today as per separate op noted. Current catheter to remain at DC. We will request trila of void in abotu 10 days with NP in our office.   [name] 11/16/2018, 11:17 AM

## 2018-11-16 NOTE — Anesthesia Procedure Notes (Signed)
Anesthesia Regional Block: Adductor canal block   Pre-Anesthetic Checklist: ,, timeout performed, Correct Patient, Correct Site, Correct Laterality, Correct Procedure, Correct Position, site marked, Risks and benefits discussed,  Surgical consent,  Pre-op evaluation,  At surgeon's request and post-op pain management  Laterality: Right  Prep: chloraprep       Needles:  Injection technique: Single-shot  Needle Type: Echogenic Needle     Needle Length: 9cm  Needle Gauge: 21     Additional Needles:   Procedures:,,,, ultrasound used (permanent image in chart),,,,  Narrative:  Start time: 11/16/2018 7:50 AM End time: 11/16/2018 7:55 AM Injection made incrementally with aspirations every 5 mL.  Performed by: Personally  Anesthesiologist: [name], MD  Additional Notes: No pain on injection. No increased resistance to injection. Injection made in 5cc increments.  Good needle visualization.  Patient tolerated procedure well.

## 2018-11-16 NOTE — Anesthesia Postprocedure Evaluation (Signed)
Anesthesia Post Note  Patient: Joseph Craig  Procedure(s) Performed: RIGHT TOTAL KNEE ARTHROPLASTY (Right Knee) CYSTOSCOPY FLEXIBLE, ureteral dilation with foley placement (N/A Urethra)     Patient location during evaluation: PACU Anesthesia Type: Spinal Level of consciousness: oriented, awake and alert and awake Pain management: pain level controlled Vital Signs Assessment: post-procedure vital signs reviewed and stable Respiratory status: spontaneous breathing, respiratory function stable, patient connected to nasal cannula oxygen and nonlabored ventilation Cardiovascular status: blood pressure returned to baseline and stable Postop Assessment: no headache, no backache, no apparent nausea or vomiting and spinal receding Anesthetic complications: no    Last Vitals:  Vitals:   11/16/18 1520 11/16/18 1620  BP: (!) 155/78 (!) 160/74  Pulse: 66 68  Resp: 17 18  Temp: 36.8 C 36.7 C  SpO2: 99% 99%    Last Pain:  Vitals:   11/16/18 1620  TempSrc: Oral  PainSc:                  [name]

## 2018-11-16 NOTE — Anesthesia Preprocedure Evaluation (Addendum)
Anesthesia Evaluation  Patient identified by MRN, date of birth, ID band Patient awake    Reviewed: Allergy & Precautions, NPO status , Patient's Chart, lab work & pertinent test results  Airway Mallampati: III  TM Distance: >3 FB Neck ROM: Full    Dental  (+) Teeth Intact, Dental Advisory Given   Pulmonary sleep apnea ,    Pulmonary exam normal breath sounds clear to auscultation       Cardiovascular hypertension, Pt. on medications (-) angina(-) CAD, (-) Past MI and (-) CHF Normal cardiovascular exam Rhythm:Regular Rate:Normal     Neuro/Psych negative neurological ROS  negative psych ROS   GI/Hepatic negative GI ROS, Neg liver ROS,   Endo/Other  Morbid obesity  Renal/GU negative Renal ROS     Musculoskeletal  (+) Arthritis , Osteoarthritis,    Abdominal   Peds  Hematology negative hematology ROS (+) Plt 267k   Anesthesia Other Findings Day of surgery medications reviewed with the patient.  Reproductive/Obstetrics                            Anesthesia Physical Anesthesia Plan  ASA: III  Anesthesia Plan: Spinal   Post-op Pain Management:  Regional for Post-op pain   Induction:   PONV Risk Score and Plan: 1 and Propofol infusion, Treatment may vary due to age or medical condition and Midazolam  Airway Management Planned: Natural Airway and Nasal Cannula  Additional Equipment:   Intra-op Plan:   Post-operative Plan:   Informed Consent: I have reviewed the patients History and Physical, chart, labs and discussed the procedure including the risks, benefits and alternatives for the proposed anesthesia with the patient or authorized representative who has indicated his/her understanding and acceptance.     Dental advisory given  Plan Discussed with: CRNA, Anesthesiologist and Surgeon  Anesthesia Plan Comments:         Anesthesia Quick Evaluation

## 2018-11-16 NOTE — Evaluation (Signed)
Physical Therapy Evaluation Patient Details Name: Joseph Craig MRN: [id number] DOB: 01-28-1946 Today's Date: 11/16/2018   History of Present Illness  73 yo male s/p R TKR on 11/16/18. PMH includes OA, R meniscus tear, HTN.   Clinical Impression  Pt presents with mild R knee pain, decreased R knee ROM, increased time and effort to perform mobility tasks, and decreased activity tolerance post-surgery. Pt to benefit from acute PT to address deficits. Pt ambulated 100 ft with RW with min guard assist, verbal cuing for form and safety provided throughout. Pt educated on ankle pumps (20/hour) to perform this afternoon/evening to increase circulation, to pt's tolerance and limited by pain. PT to progress mobility as tolerated, and will continue to follow acutely.        Follow Up Recommendations Follow surgeon's recommendation for DC plan and follow-up therapies;Supervision for mobility/OOB(HHPT)    Equipment Recommendations  None recommended by PT    Recommendations for Other Services       Precautions / Restrictions Precautions Precautions: Fall Restrictions Weight Bearing Restrictions: No Other Position/Activity Restrictions: WBAT      Mobility  Bed Mobility Overal bed mobility: Needs Assistance Bed Mobility: Supine to Sit     Supine to sit: Min guard;HOB elevated     General bed mobility comments: Min guard for safety, increased time and effort with use of bed rail.   Transfers Overall transfer level: Needs assistance Equipment used: Rolling walker (2 wheeled) Transfers: Sit to/from Stand Sit to Stand: Min guard;From elevated surface         General transfer comment: Min guard for safety, verbal cuing for hand placement when rising.   Ambulation/Gait Ambulation/Gait assistance: Min guard;+2 safety/equipment Gait Distance (Feet): 100 Feet Assistive device: Rolling walker (2 wheeled) Gait Pattern/deviations: Step-through pattern;Decreased stride length;Step-to  pattern;Trunk flexed Gait velocity: slightly decr, improved with further ambulation distance    General Gait Details: Min guard for safety, verbal cuing for upright posture, placement in RW, sequencing although pt quickly progressing to step-through gait.   Stairs            Wheelchair Mobility    Modified Rankin (Stroke Patients Only)       Balance Overall balance assessment: Mild deficits observed, not formally tested                                           Pertinent Vitals/Pain Pain Assessment: 0-10 Pain Score: 2  Pain Location: R knee  Pain Descriptors / Indicators: Sore Pain Intervention(s): Monitored during session;Repositioned;Limited activity within patient's tolerance;Premedicated before session;Ice applied    Home Living Family/patient expects to be discharged to:: Private residence Living Arrangements: Alone Available Help at Discharge: Family;Available 24 hours/day(granddaughter and grandson to stay with pt for up to 2 weeks; daughter will initially be helping as well) Type of Home: House Home Access: Stairs to enter Entrance Stairs-Rails: None Entrance Stairs-Number of Steps: 2+1 Home Layout: One level;Laundry or work area in [place]: Environmental consultant - 2 wheels;Bedside commode;Cane - single point      Prior Function Level of Independence: Independent               Hand Dominance   Dominant Hand: Right    Extremity/Trunk Assessment   Upper Extremity Assessment Upper Extremity Assessment: Overall WFL for tasks assessed    Lower Extremity Assessment Lower Extremity Assessment: Overall WFL for tasks assessed;RLE deficits/detail RLE  Deficits / Details: suspected post-surgical weakness; able to perform ankle pumps, quad set, SLR without lift assist or quad lag, heel slide to 60*  RLE Sensation: WNL    Cervical / Trunk Assessment Cervical / Trunk Assessment: Normal  Communication   Communication: No difficulties   Cognition Arousal/Alertness: Awake/alert Behavior During Therapy: WFL for tasks assessed/performed Overall Cognitive Status: Within Functional Limits for tasks assessed                                        General Comments      Exercises     Assessment/Plan    PT Assessment Patient needs continued PT services  PT Problem List Decreased strength;Decreased range of motion;Decreased balance;Decreased knowledge of use of DME;Pain;Decreased activity tolerance;Decreased mobility       PT Treatment Interventions DME instruction;Functional mobility training;Patient/family education;Balance training;Gait training;Therapeutic activities;Stair training;Therapeutic exercise    PT Goals (Current goals can be found in the Care Plan section)  Acute Rehab PT Goals Patient Stated Goal: go home, return to work at the end of August PT Goal Formulation: With patient Time For Goal Achievement: 11/23/18 Potential to Achieve Goals: Good    Frequency 7X/week   Barriers to discharge        Co-evaluation               AM-PAC PT "6 Clicks" Mobility  Outcome Measure Help needed turning from your back to your side while in a flat bed without using bedrails?: A Little Help needed moving from lying on your back to sitting on the side of a flat bed without using bedrails?: A Little Help needed moving to and from a bed to a chair (including a wheelchair)?: A Little Help needed standing up from a chair using your arms (e.g., wheelchair or bedside chair)?: A Little Help needed to walk in hospital room?: A Little Help needed climbing 3-5 steps with a railing? : A Lot 6 Click Score: 17    End of Session Equipment Utilized During Treatment: Gait belt Activity Tolerance: Patient tolerated treatment well Patient left: in chair;with chair alarm set;with call bell/phone within reach(Pt with + Pitting edema, left SCDs off while pt in chair, instructed pt to perform ankle pumps  often) Nurse Communication: Mobility status PT Visit Diagnosis: Other abnormalities of gait and mobility (R26.89);Difficulty in walking, not elsewhere classified (R26.2)    Time: 1287-8676 PT Time Calculation (min) (ACUTE ONLY): 25 min   Charges:   PT Evaluation $PT Eval Low Complexity: 1 Low PT Treatments $Gait Training: 8-22 mins        Julien Girt, PT Acute Rehabilitation Services Pager [phone]  Office [phone]   Mirai Greenwood D Elonda Husky 11/16/2018, 4:42 PM

## 2018-11-16 NOTE — Telephone Encounter (Signed)
Please advise 

## 2018-11-16 NOTE — Progress Notes (Signed)
Assisted Dr. Turk with right, ultrasound guided, adductor canal block. Side rails up, monitors on throughout procedure. See vital signs in flow sheet. Tolerated Procedure well.  

## 2018-11-16 NOTE — Op Note (Signed)
NAME: Joseph Craig, Joseph Craig MEDICAL RECORD PQ:[id number] ACCOUNT [id number] DATE OF BIRTH:01-19-1946 FACILITY: [facility] LOCATION: [facility] PHYSICIAN:CHRISTOPHER Kerry Fort, MD  OPERATIVE REPORT  DATE OF PROCEDURE:  11/16/2018  PREOPERATIVE DIAGNOSIS:  Primary osteoarthritis and degenerative joint disease, right knee.  POSTOPERATIVE DIAGNOSIS:  Primary osteoarthritis and degenerative joint disease, right knee.  PROCEDURE:  Right total knee arthroplasty.  IMPLANTS:  Stryker Triathlon cemented knee system with size 5 femur, size 5 tibial tray, 11 mm fixed bearing polyethylene insert, size 35 patellar button.  SURGEON:  Lind Guest. Ninfa Linden, MD  ASSISTANT:  Erskine Emery, PA-C  ANESTHESIA: 1.  Right lower extremity adductor canal block. 2.  Spinal.  ANTIBIOTICS:  900 mg IV clindamycin.  ESTIMATED BLOOD LOSS:  100 mL.  TOURNIQUET TIME:  1 hour.  COMPLICATIONS:  None.  INDICATIONS:  The patient is a 73 year old obese gentleman that I have known for over 5 years now.  He originally injured this right knee in a work-related accident and sustained a medial meniscal tear.  He underwent a partial medial meniscectomy and did  have some preexisting arthritis on his right knee.  However, given the extent of his meniscal tear and with time, this has led to severe end-stage arthritis of the right knee, especially of that medial compartment where he had the meniscal tear.  At  this point, he has tried and failed all forms of conservative treatment including activity modification, weight loss, quad strengthening exercises.  He has been to therapy.  He has had intraarticular steroid injections and hyaluronic acid.  At this  point, his pain is daily, and it has detrimentally affected his mobility, his quality of life, and his activities of daily living to the point he does wish to proceed with total knee arthroplasty.  Even at 34, he still works.  We talked about the risk of  acute blood loss anemia, nerve  and vessel injury, fracture, infection, DVT, and implant failure.  We talked about the goals being decreased pain, improved mobility and overall improved quality of life.  He is a very motivated individual as well.  DESCRIPTION OF PROCEDURE:  After informed consent was obtained and appropriate right knee was marked, an adductor canal block was obtained in the holding room.  He was then brought to the operating room and sat up on the operating table where spinal  anesthesia was then obtained.  We then attempted to place a Foley catheter.  It was quite difficult, so an intraoperative urology consult was obtained.  The urologist was able to place the Foley catheter and gave [place] instructions about leaving the  catheter in place until outpatient followup.  Once that was done, the nonsterile tourniquet was placed around his upper right thigh, and his right thigh, knee, leg and ankle were prepped and draped with DuraPrep and sterile drapes including a sterile  stockinette.  Time-out was called, and he was identified as correct patient, correct right knee.  We then made an incision directly over the patella and carried this proximally and distally.  We dissected down the knee joint and carried out a medial  parapatellar arthrotomy.  I found a large joint effusion and significant arthritis throughout his knee, especially in the medial compartment where there was complete loss of joint space and varus malalignment.  With the knee in a flexed position, we  removed remnants of ACL, PCL, medial and lateral meniscus.  We set our extramedullary cutting guide for the tibia, taking 9 mm off the high side, correcting for a  neutral slope and correcting for varus and valgus.  We made this cut without difficulty.   We then used an intramedullary drill in the notch area to place our distal femoral cutting guide based off her right knee at 5 degrees externally rotated and for a 10 mm distal femoral cut.  We made this cut without  difficulty and brought the knee back  down to full extension.  With a 9 mm extension block, he had achieved full extension and slightly hyperextended.  We then went back to the femur and put our femoral sizing guide based off the epicondylar axis and Whites[place] line.  We chose a size 5 femur  based off of this.  We then placed our 4-in-1 cutting block for a size 5 femur and made our anterior and posterior cuts, followed by our chamfer cuts.  We then made our femoral box cut.  Attention was then turned back to the tibia.  We trialed the size  5 tibial tray for coverage, and we liked the fit of this.  We set the rotation off the femur and the tibial tubercle and made our keel cut off of the size 5 tibial tray.  With the size 5 tibial tray in place, we trialed a size 5 right femur.  We placed a  9 mm fixed-bearing polyethylene insert and felt they gave him some stability, but we felt like he needed a little bit thicker liner.  We then made our patellar cut and drilled 3 holes for a size 35 patellar button.  With all trial instrumentation in the  knee, we put the knee through range of motion with the 11 mm polyethylene insert, and we were pleased with stability and range of motion.  We then removed all instrumentation from the knee and irrigated the knee with normal saline solution using  pulsatile lavage.  We then placed a mixture of 0.25% plain Marcaine with epinephrine around the joint capsule.  We then mixed our cement and cemented the real Stryker Triathlon tibial tray size 5 followed by the real size 5 femur.  We removed cement  debris from the knee and then placed our 11 mm thickness fixed-bearing polyethylene insert and cemented our size 35 polyethylene patellar button.  Once the cement had hardened, we removed cement debris from the knee with the tourniquet down and  hemostasis was obtained with electrocautery.  We then closed the arthrotomy with interrupted #1 Vicryl suture followed by 0 Vicryl in the  deep tissue, 2-0 Vicryl was used to close the subcutaneous tissue and interrupted staples were used to close the  skin.  A well-padded sterile dressing was applied.  He was taken to the recovery room in stable condition.  All final counts were correct.  There were no complications noted.  Of note, Benita Stabile, PA-C, assisted the entire case.  His assistance was  crucial for facilitating all aspects of this case.  LN/NUANCE  D:11/16/2018 T:11/16/2018 JOB:[id number]

## 2018-11-16 NOTE — Transfer of Care (Signed)
Immediate Anesthesia Transfer of Care Note  Patient: Joseph Craig  Procedure(s) Performed: RIGHT TOTAL KNEE ARTHROPLASTY (Right Knee) CYSTOSCOPY FLEXIBLE, ureteral dilation with foley placement (N/A Urethra)  Patient Location: PACU  Anesthesia Type:Spinal  Level of Consciousness: awake, alert  and oriented  Airway & Oxygen Therapy: Patient Spontanous Breathing and Patient connected to nasal cannula oxygen  Post-op Assessment: Report given to RN and Post -op Vital signs reviewed and stable  Post vital signs: Reviewed and stable  Last Vitals:  Vitals Value Taken Time  BP 110/87 11/16/2018 11:51 AM  Temp    Pulse 62 11/16/2018 11:52 AM  Resp 14 11/16/2018 11:52 AM  SpO2 100 % 11/16/2018 11:52 AM  Vitals shown include unvalidated device data.  Last Pain:  Vitals:   11/16/18 0642  TempSrc:   PainSc: 3       Patients Stated Pain Goal: 4 (71/69/67 8938)  Complications: No apparent anesthesia complications

## 2018-11-16 NOTE — Brief Op Note (Signed)
11/16/2018  11:19 AM  PATIENT:  Joseph Craig  73 y.o. male  PRE-OPERATIVE DIAGNOSIS:  osteoarthritis right knee  POST-OPERATIVE DIAGNOSIS:  osteoarthritis right knee  PROCEDURE:  Procedure(s) with comments: RIGHT TOTAL KNEE ARTHROPLASTY (Right) - with block CYSTOSCOPY FLEXIBLE, ureteral dilation with foley placement (N/A)  SURGEON:  Surgeon(s) and Role:    * Mcarthur Rossetti, MD - Primary    [name], MD  PHYSICIAN ASSISTANT: Benita Stabile, PA-C  ANESTHESIA:   regional and spinal  EBL:  200 mL   COUNTS:  YES  TOURNIQUET:   Total Tourniquet Time Documented: Thigh (Right) - 60 minutes Total: Thigh (Right) - 60 minutes   DICTATION: .Other Dictation: Dictation Number [id number]  PLAN OF CARE: Admit to inpatient   PATIENT DISPOSITION:  PACU - hemodynamically stable.   Delay start of Pharmacological VTE agent (>24hrs) due to surgical blood loss or risk of bleeding: no

## 2018-11-16 NOTE — Anesthesia Procedure Notes (Signed)
Spinal  Patient location during procedure: OR Start time: 11/16/2018 9:02 AM End time: 11/16/2018 9:04 AM Staffing Anesthesiologist: [name], MD Performed: anesthesiologist  Preanesthetic Checklist Completed: patient identified, surgical consent, pre-op evaluation, timeout performed, IV checked, risks and benefits discussed and monitors and equipment checked Spinal Block Patient position: sitting Prep: site prepped and draped and DuraPrep Patient monitoring: continuous pulse ox and blood pressure Approach: midline Location: L3-4 Injection technique: single-shot Needle Needle type: Pencan  Needle gauge: 24 G Assessment Sensory level: T8 Additional Notes Functioning IV was confirmed and monitors were applied. Sterile prep and drape, including hand hygiene, mask and sterile gloves were used. The patient was positioned and the spine was prepped. The skin was anesthetized with lidocaine.  Free flow of clear CSF was obtained prior to injecting local anesthetic into the CSF.  The spinal needle aspirated freely following injection.  The needle was carefully withdrawn.  The patient tolerated the procedure well. Consent was obtained prior to procedure with all questions answered and concerns addressed. Risks including but not limited to bleeding, infection, nerve damage, paralysis, failed block, inadequate analgesia, allergic reaction, high spinal, itching and headache were discussed and the patient wished to proceed.   Hoy Morn, MD

## 2018-11-16 NOTE — H&P (Signed)
TOTAL KNEE ADMISSION H&P  Patient is being admitted for right total knee arthroplasty.  Subjective:  Chief Complaint:right knee pain.  HPI: Joseph Craig, 73 y.o. male, has a history of pain and functional disability in the right knee due to trauma and arthritis and has failed non-surgical conservative treatments for greater than 12 weeks to includeNSAID's and/or analgesics, corticosteriod injections, viscosupplementation injections, flexibility and strengthening excercises, weight reduction as appropriate and activity modification.  Onset of symptoms was gradual, starting 3 years ago with gradually worsening course since that time. The patient noted prior procedures on the knee to include  arthroscopy and menisectomy on the right knee(s).  Patient currently rates pain in the right knee(s) at 10 out of 10 with activity. Patient has night pain, worsening of pain with activity and weight bearing, pain that interferes with activities of daily living, pain with passive range of motion, crepitus and joint swelling.  Patient has evidence of subchondral sclerosis, periarticular osteophytes and joint space narrowing by imaging studies. There is no active infection.  Patient Active Problem List   Diagnosis Date Noted  . Unilateral primary osteoarthritis, right knee 06/29/2017  . Status post arthroscopy of right knee 02/02/2017  . 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 Primary osteoarthritis of right knee 10/14/2016  . Unilateral primary osteoarthritis, left knee 08/03/2016  . Chronic pain of right knee 08/03/2016   Past Medical History:  Diagnosis Date  . Arthritis   . 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 Hypertension   . Sinus congestion     History reviewed. No pertinent surgical history.  Current Facility-Administered Medications  Medication Dose Route Frequency Provider Last Rate Last Dose  . chlorhexidine (HIBICLENS) 4 % liquid 4 application  60 mL  Topical Once Erskine Emery W, PA-C      . clindamycin (CLEOCIN) IVPB 900 mg  900 mg Intravenous Once Mcarthur Rossetti, MD      . fentaNYL (SUBLIMAZE) injection 50-100 mcg  50-100 mcg Intravenous UD [name], MD      . lactated ringers infusion   Intravenous Continuous [name], MD 20 mL/hr at 11/16/18 0645    . midazolam (VERSED) injection 1-2 mg  1-2 mg Intravenous UD [name], MD      . povidone-iodine 10 % swab 2 application  2 application Topical Once Erskine Emery W, PA-C      . tranexamic acid (CYKLOKAPRON) IVPB 1,000 mg  1,000 mg Intravenous To OR Pete Pelt, PA-C       Allergies  Allergen Reactions  . Penicillins Anaphylaxis    Did it involve swelling of the face/tongue/throat, SOB, or low BP? Yes Did it involve sudden or severe rash/hives, skin peeling, or any reaction on the inside of your mouth or nose? No Did you need to seek medical attention at a hospital or doctor's office? Yes When did it last happen?childhood If all above answers are "NO", may proceed with cephalosporin use.     Social History   Tobacco Use  . Smoking status: Never Smoker  . Smokeless tobacco: Never Used  Substance Use Topics  . Alcohol use: Yes    Alcohol/week: 1.0 standard drinks    Types: 1 Cans of beer per week    Comment: daily    History reviewed. No pertinent family history.   Review of Systems  Musculoskeletal: Positive for joint pain.  All other systems reviewed and are negative.   Objective:  Physical Exam  Constitutional: He is oriented to person, place, and time. He appears well-developed and well-nourished.  HENT:  Head: Normocephalic and atraumatic.  Eyes: Pupils are equal, round, and reactive to light. EOM are normal.  Neck: Normal range of motion. Neck supple.  Cardiovascular: Normal rate.  Respiratory: Effort normal.  GI: Soft.  Musculoskeletal:     Right knee: He exhibits decreased range of motion, effusion,  abnormal alignment, bony tenderness and abnormal meniscus. Tenderness found. Medial joint line and lateral joint line tenderness noted.  Neurological: He is alert and oriented to person, place, and time.  Skin: Skin is warm and dry.  Psychiatric: He has a normal mood and affect.    Vital signs in last 24 hours: Temp:  [98.6 F (37 C)] 98.6 F (37 C) (06/05 0604) Pulse Rate:  [64] 64 (06/05 0604) Resp:  [16] 16 (06/05 0604) BP: (150)/(86) 150/86 (06/05 0604) SpO2:  [96 %] 96 % (06/05 0604) Weight:  [919 kg] 127 kg (06/05 0642)  Labs:   Estimated body mass index is 42.57 kg/m as calculated from the following:   Height as of this encounter: 5\' 8"  (1.727 m).   Weight as of this encounter: 127 kg.   Imaging Review Plain radiographs demonstrate severe degenerative joint disease of the right knee(s). The overall alignment ismild varus. The bone quality appears to be good for age and reported activity level.      Assessment/Plan:  End stage arthritis, right knee   The patient history, physical examination, clinical judgment of the provider and imaging studies are consistent with end stage degenerative joint disease of the right knee(s) and total knee arthroplasty is deemed medically necessary. The treatment options including medical management, injection therapy arthroscopy and arthroplasty were discussed at length. The risks and benefits of total knee arthroplasty were presented and reviewed. The risks due to aseptic loosening, infection, stiffness, patella tracking problems, thromboembolic complications and other imponderables were discussed. The patient acknowledged the explanation, agreed to proceed with the plan and consent was signed. Patient is being admitted for inpatient treatment for surgery, pain control, PT, OT, prophylactic antibiotics, VTE prophylaxis, progressive ambulation and ADL's and discharge planning. The patient is planning to be discharged home with home health  services    Anticipated LOS equal to or greater than 2 midnights due to - Age 66 and older with one or more of the following:  - Obesity  - Expected need for hospital services (PT, OT, Nursing) required for safe  discharge  - Anticipated need for postoperative skilled nursing care or inpatient rehab  - Active co-morbidities: None OR   - Unanticipated findings during/Post Surgery: None

## 2018-11-17 LAB — BASIC METABOLIC PANEL
Anion gap: 7 (ref 5–15)
BUN: 12 mg/dL (ref 8–23)
CO2: 26 mmol/L (ref 22–32)
Calcium: 8.5 mg/dL — ABNORMAL LOW (ref 8.9–10.3)
Chloride: 102 mmol/L (ref 98–111)
Creatinine, Ser: 0.73 mg/dL (ref 0.61–1.24)
GFR calc Af Amer: >60 mL/min (ref >60)
GFR calc non Af Amer: >60 mL/min (ref >60)
Glucose, Bld: 132 mg/dL — ABNORMAL HIGH (ref 70–99)
Potassium: 4.2 mmol/L (ref 3.5–5.1)
Sodium: 135 mmol/L (ref 135–145)

## 2018-11-17 LAB — CBC
HCT: 37 % — ABNORMAL LOW (ref 39.0–52.0)
Hemoglobin: 11.9 g/dL — ABNORMAL LOW (ref 13.0–17.0)
MCH: 31.4 pg (ref 26.0–34.0)
MCHC: 32.2 g/dL (ref 30.0–36.0)
MCV: 97.6 fL (ref 80.0–100.0)
Platelets: 238 10*3/uL (ref 150–400)
RBC: 3.79 MIL/uL — ABNORMAL LOW (ref 4.22–5.81)
RDW: 12.7 % (ref 11.5–15.5)
WBC: 14.6 10*3/uL — ABNORMAL HIGH (ref 4.0–10.5)
nRBC: 0 % (ref 0.0–0.2)

## 2018-11-17 MED ORDER — RIVAROXABAN 10 MG PO TABS: 10.0000 mg | ORAL_TABLET | Freq: Every day | ORAL | 0 refills | Status: DC

## 2018-11-17 MED ORDER — OXYCODONE HCL 5 MG PO TABS: 5.0000 mg | ORAL_TABLET | Freq: Four times a day (QID) | ORAL | 0 refills | Status: DC | PRN

## 2018-11-17 MED ORDER — METHOCARBAMOL 500 MG PO TABS: 500.0000 mg | ORAL_TABLET | Freq: Four times a day (QID) | ORAL | 0 refills | Status: DC | PRN

## 2018-11-17 NOTE — Progress Notes (Signed)
Subjective: 1 Day Post-Op Procedure(s) (LRB): RIGHT TOTAL KNEE ARTHROPLASTY (Right) CYSTOSCOPY FLEXIBLE, ureteral dilation with foley placement (N/A) Patient reports pain as moderate.    Objective: Vital signs in last 24 hours: Temp:  [97.6 F (36.4 C)-98.2 F (36.8 C)] 97.7 F (36.5 C) (06/06 0528) Pulse Rate:  [57-68] 62 (06/06 0950) Resp:  [16-19] 18 (06/06 0528) BP: (104-165)/(65-90) 104/74 (06/06 0950) SpO2:  [97 %-100 %] 98 % (06/06 0950)  Intake/Output from previous day: 06/05 0701 - 06/06 0700 In: 2597.3 [P.O.:840; I.V.:1757.3] Out: 4900 [Urine:4700; Blood:200] Intake/Output this shift: Total I/O In: 240 [P.O.:240] Out: 125 [Urine:125]  Recent Labs    11/17/18 0316  HGB 11.9*   Recent Labs    11/17/18 0316  WBC 14.6*  RBC 3.79*  HCT 37.0*  PLT 238   Recent Labs    11/17/18 0316  NA 135  K 4.2  CL 102  CO2 26  BUN 12  CREATININE 0.73  GLUCOSE 132*  CALCIUM 8.5*   No results for input(s): LABPT, INR in the last 72 hours.  Sensation intact distally Intact pulses distally Dorsiflexion/Plantar flexion intact Incision: dressing C/D/I No cellulitis present Compartment soft   Assessment/Plan: 1 Day Post-Op Procedure(s) (LRB): RIGHT TOTAL KNEE ARTHROPLASTY (Right) CYSTOSCOPY FLEXIBLE, ureteral dilation with foley placement (N/A) Up with therapy Plan for discharge tomorrow Discharge home with home health   Anticipated LOS equal to or greater than 2 midnights due to - Age 27 and older with one or more of the following:  - Obesity  - Expected need for hospital services (PT, OT, Nursing) required for safe  discharge  - Anticipated need for postoperative skilled nursing care or inpatient rehab  - Active co-morbidities: None OR   - Unanticipated findings during/Post Surgery: None  - Patient is a high risk of re-admission due to: None    Mcarthur Rossetti 11/17/2018, 10:44 AM

## 2018-11-17 NOTE — Progress Notes (Signed)
Physical Therapy Treatment Patient Details Name: Joseph Craig MRN: [id number] DOB: 1945/07/19 Today's Date: 11/17/2018    History of Present Illness 73 yo male s/p R TKR on 11/16/18. PMH includes OA, R meniscus tear, HTN.     PT Comments    Steady progress with mobility.  Pt hopeful for dc home tomorrow with family assist.   Follow Up Recommendations  Follow surgeon's recommendation for DC plan and follow-up therapies;Supervision for mobility/OOB     Equipment Recommendations  None recommended by PT    Recommendations for Other Services       Precautions / Restrictions Precautions Precautions: Fall Restrictions Weight Bearing Restrictions: No Other Position/Activity Restrictions: WBAT    Mobility  Bed Mobility Overal bed mobility: Needs Assistance Bed Mobility: Supine to Sit     Supine to sit: Min guard;HOB elevated     General bed mobility comments: Min guard for safety, increased time and effort with use of bed rail.   Transfers Overall transfer level: Needs assistance Equipment used: Rolling walker (2 wheeled) Transfers: Sit to/from Stand Sit to Stand: Min guard;From elevated surface         General transfer comment: Min guard for safety, verbal cuing for hand placement when rising.   Ambulation/Gait Ambulation/Gait assistance: Min assist;Min guard Gait Distance (Feet): 150 Feet Assistive device: Rolling walker (2 wheeled) Gait Pattern/deviations: Step-through pattern;Decreased stride length;Step-to pattern;Trunk flexed Gait velocity: slightly decr, improved with further ambulation distance    General Gait Details: Min guard for safety, verbal cuing for upright posture, placement in RW, sequencing although pt quickly progressing to step-through gait.    Stairs             Wheelchair Mobility    Modified Rankin (Stroke Patients Only)       Balance Overall balance assessment: Mild deficits observed, not formally tested                                          Cognition Arousal/Alertness: Awake/alert Behavior During Therapy: WFL for tasks assessed/performed Overall Cognitive Status: Within Functional Limits for tasks assessed                                        Exercises Total Joint Exercises Ankle Circles/Pumps: AROM;Both;20 reps;Supine Quad Sets: AROM;Both;10 reps;Supine Heel Slides: AAROM;Right;15 reps;Supine Straight Leg Raises: AAROM;AROM;Right;15 reps;Supine Goniometric ROM: AAROM R knee -10 - 50    General Comments        Pertinent Vitals/Pain Pain Assessment: 0-10 Pain Score: 5  Pain Location: R knee  Pain Descriptors / Indicators: Sore Pain Intervention(s): Limited activity within patient's tolerance;Monitored during session;Premedicated before session;Ice applied    Home Living                      Prior Function            PT Goals (current goals can now be found in the care plan section) Acute Rehab PT Goals Patient Stated Goal: go home, return to work at the end of August PT Goal Formulation: With patient Time For Goal Achievement: 11/23/18 Potential to Achieve Goals: Good Progress towards PT goals: Progressing toward goals    Frequency    7X/week      PT Plan Current plan remains appropriate    Co-evaluation  AM-PAC PT "6 Clicks" Mobility   Outcome Measure  Help needed turning from your back to your side while in a flat bed without using bedrails?: A Little Help needed moving from lying on your back to sitting on the side of a flat bed without using bedrails?: A Little Help needed moving to and from a bed to a chair (including a wheelchair)?: A Little Help needed standing up from a chair using your arms (e.g., wheelchair or bedside chair)?: A Little Help needed to walk in hospital room?: A Little Help needed climbing 3-5 steps with a railing? : A Lot 6 Click Score: 17    End of Session Equipment Utilized During  Treatment: Gait belt Activity Tolerance: Patient tolerated treatment well Patient left: in chair;with chair alarm set;with call bell/phone within reach Nurse Communication: Mobility status PT Visit Diagnosis: Other abnormalities of gait and mobility (R26.89);Difficulty in walking, not elsewhere classified (R26.2)     Time: 1010-1040 PT Time Calculation (min) (ACUTE ONLY): 30 min  Charges:  $Gait Training: 8-22 mins $Therapeutic Exercise: 8-22 mins                     Joseph Craig PT Acute Rehabilitation Services Pager [phone] Office [phone]    Joseph Craig 11/17/2018, 12:34 PM

## 2018-11-17 NOTE — TOC Initial Note (Signed)
Transition of Care [facility]) - Initial/Assessment Note    Patient Details  Name: Joseph Craig MRN: [id number] Date of Birth: May 09, 1946  [facility] CM/SW Contact:    Joaquin Courts, RN Phone Number: 11/17/2018, 2:05 PM  Clinical Narrative:     CM spoke with patient at bedside. Patient reports his granddaughter and grandson will stay with him to help after hospital d/c.  Patient states he did not bring any contact information for the worker's comp rep working on his case.  CM reached out to HR rep for patient;s employer with permission but no response. CM reached out to [employer] listed on facesheet, however business hours are Monday-Friday. CM will continue to follow and attempt to get in tough with workers comp rep to establish which agency shoud be used for pt's [place] and DME needs.           Expected Discharge Plan: [place] Barriers to Discharge: Continued Medical Work up   Patient Goals and CMS Choice Patient states their goals for this hospitalization and ongoing recovery are:: to go home      Expected Discharge Plan and Services Expected Discharge Plan: [place]   Discharge Planning Services: CM Consult   Living arrangements for the past 2 months: Single Family Home                                      Prior Living Arrangements/Services Living arrangements for the past 2 months: Single Family Home Lives with:: Self Patient language and need for interpreter reviewed:: Yes Do you feel safe going back to the place where you live?: Yes      Need for Family Participation in Patient Care: Yes (Comment) Care giver support system in place?: Yes (comment)   Criminal Activity/Legal Involvement Pertinent to Current Situation/Hospitalization: No - Comment as needed  Activities of Daily Living Home Assistive Devices/Equipment: Cane (specify quad or straight), Blood pressure cuff, Eyeglasses, Raised toilet  seat with rails(straight cane) ADL Screening (condition at time of admission) Patient's cognitive ability adequate to safely complete daily activities?: Yes Is the patient deaf or have difficulty hearing?: No Does the patient have difficulty seeing, even when wearing glasses/contacts?: No Does the patient have difficulty concentrating, remembering, or making decisions?: No Patient able to express need for assistance with ADLs?: Yes Does the patient have difficulty dressing or bathing?: No Independently performs ADLs?: Yes (appropriate for developmental age) Does the patient have difficulty walking or climbing stairs?: Yes Weakness of Legs: None Weakness of Arms/Hands: None  Permission Sought/Granted                  Emotional Assessment Appearance:: Appears stated age Attitude/Demeanor/Rapport: Engaged Affect (typically observed): Accepting Orientation: : Oriented to Self, Oriented to Place, Oriented to Situation, Oriented to  Time   Psych Involvement: No (comment)  Admission diagnosis:  osteoarthritis right knee Patient Active Problem List   Diagnosis Date Noted  . Status post total right knee replacement 11/16/2018  . Unilateral primary osteoarthritis, right knee 06/29/2017  . Status post arthroscopy of right knee 02/02/2017  . 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 Primary osteoarthritis of right knee 10/14/2016  . Unilateral primary osteoarthritis, left knee 08/03/2016  . Chronic pain of right knee 08/03/2016   PCP:  Patient, No Pcp Per Pharmacy:   [id number] -  [place], [address] [place] [address] Phone: [phone] Fax: [phone]  [place], [place] [address] [place] [place] [address] Phone: [phone] Fax: [phone]     Social Determinants of Health (SDOH) Interventions    Readmission Risk Interventions No  flowsheet data found.

## 2018-11-17 NOTE — Progress Notes (Signed)
Physical Therapy Treatment Patient Details Name: Joseph Craig MRN: [id number] DOB: 06-16-1945 Today's Date: 11/17/2018    History of Present Illness 73 yo male s/p R TKR on 11/16/18. PMH includes OA, R meniscus tear, HTN.     PT Comments    Pt progressing steadily with mobility and hopeful to dc home tomorrow.  Pt    Follow Up Recommendations  Follow surgeon's recommendation for DC plan and follow-up therapies;Supervision for mobility/OOB     Equipment Recommendations  None recommended by PT    Recommendations for Other Services       Precautions / Restrictions Precautions Precautions: Fall Restrictions Weight Bearing Restrictions: No Other Position/Activity Restrictions: WBAT    Mobility  Bed Mobility Overal bed mobility: Needs Assistance Bed Mobility: Supine to Sit     Supine to sit: Min guard;HOB elevated     General bed mobility comments: Min guard for safety, increased time and effort with use of bed rail.   Transfers Overall transfer level: Needs assistance Equipment used: Rolling walker (2 wheeled) Transfers: Sit to/from Stand Sit to Stand: Min guard;From elevated surface         General transfer comment: Min guard for safety, verbal cuing for hand placement when rising.   Ambulation/Gait Ambulation/Gait assistance: Min assist;Min guard Gait Distance (Feet): 180 Feet Assistive device: Rolling walker (2 wheeled) Gait Pattern/deviations: Step-through pattern;Decreased stride length;Step-to pattern;Trunk flexed Gait velocity: slightly decr, improved with further ambulation distance    General Gait Details: Min guard for safety, verbal cuing for upright posture, placement in RW, sequencing although pt quickly progressing to step-through gait.    Stairs             Wheelchair Mobility    Modified Rankin (Stroke Patients Only)       Balance Overall balance assessment: Mild deficits observed, not formally tested                                           Cognition Arousal/Alertness: Awake/alert Behavior During Therapy: WFL for tasks assessed/performed Overall Cognitive Status: Within Functional Limits for tasks assessed                                        Exercises Total Joint Exercises Ankle Circles/Pumps: AROM;Both;20 reps;Supine Quad Sets: AROM;Both;10 reps;Supine Heel Slides: AAROM;Right;15 reps;Supine Straight Leg Raises: AAROM;AROM;Right;15 reps;Supine Goniometric ROM: AAROM R knee -10 - 50    General Comments        Pertinent Vitals/Pain Pain Assessment: 0-10 Pain Score: 5  Pain Location: R knee  Pain Descriptors / Indicators: Sore Pain Intervention(s): Limited activity within patient's tolerance;Monitored during session;Premedicated before session;Ice applied    Home Living                      Prior Function            PT Goals (current goals can now be found in the care plan section) Acute Rehab PT Goals Patient Stated Goal: go home, return to work at the end of August PT Goal Formulation: With patient Time For Goal Achievement: 11/23/18 Potential to Achieve Goals: Good Progress towards PT goals: Progressing toward goals    Frequency    7X/week      PT Plan Current plan remains appropriate  Co-evaluation              AM-PAC PT "6 Clicks" Mobility   Outcome Measure  Help needed turning from your back to your side while in a flat bed without using bedrails?: A Little Help needed moving from lying on your back to sitting on the side of a flat bed without using bedrails?: A Little Help needed moving to and from a bed to a chair (including a wheelchair)?: A Little Help needed standing up from a chair using your arms (e.g., wheelchair or bedside chair)?: A Little Help needed to walk in hospital room?: A Little Help needed climbing 3-5 steps with a railing? : A Lot 6 Click Score: 17    End of Session Equipment Utilized During  Treatment: Gait belt Activity Tolerance: Patient tolerated treatment well Patient left: in bed;with call bell/phone within reach;with bed alarm set Nurse Communication: Mobility status PT Visit Diagnosis: Other abnormalities of gait and mobility (R26.89);Difficulty in walking, not elsewhere classified (R26.2)     Time: 0076-2263 PT Time Calculation (min) (ACUTE ONLY): 19 min  Charges:  $Gait Training: 8-22 mins $Therapeutic Exercise: 8-22 mins                     [place] Pager [phone] Office [phone]    Eino Whitner 11/17/2018, 3:05 PM

## 2018-11-18 LAB — CBC
HCT: 35.1 % — ABNORMAL LOW (ref 39.0–52.0)
Hemoglobin: 11.8 g/dL — ABNORMAL LOW (ref 13.0–17.0)
MCH: 32.8 pg (ref 26.0–34.0)
MCHC: 33.6 g/dL (ref 30.0–36.0)
MCV: 97.5 fL (ref 80.0–100.0)
Platelets: 225 10*3/uL (ref 150–400)
RBC: 3.6 MIL/uL — ABNORMAL LOW (ref 4.22–5.81)
RDW: 12.9 % (ref 11.5–15.5)
WBC: 12.7 10*3/uL — ABNORMAL HIGH (ref 4.0–10.5)
nRBC: 0 % (ref 0.0–0.2)

## 2018-11-18 NOTE — Progress Notes (Signed)
Physical Therapy Treatment Patient Details Name: Joseph Craig MRN: [id number] DOB: 03/07/1946 Today's Date: 11/18/2018    History of Present Illness 73 yo male s/p R TKR on 11/16/18. PMH includes OA, R meniscus tear, HTN.     PT Comments    Pt cooperative but ltd by pain - pt ambulated to bathroom requiring increased time and with decreased WB tolerance on R LE.  Follow Up Recommendations  Follow surgeon's recommendation for DC plan and follow-up therapies;Supervision for mobility/OOB     Equipment Recommendations  None recommended by PT    Recommendations for Other Services       Precautions / Restrictions Precautions Precautions: Fall Restrictions Weight Bearing Restrictions: No Other Position/Activity Restrictions: WBAT    Mobility  Bed Mobility Overal bed mobility: Needs Assistance Bed Mobility: Supine to Sit     Supine to sit: Min guard;HOB elevated     General bed mobility comments: Min guard for safety, increased time and effort with use of bed rail.   Transfers Overall transfer level: Needs assistance Equipment used: Rolling walker (2 wheeled) Transfers: Sit to/from Stand Sit to Stand: Min guard;From elevated surface         General transfer comment: Min guard for safety, verbal cuing for hand placement when rising.   Ambulation/Gait Ambulation/Gait assistance: Min assist;Min guard Gait Distance (Feet): 17 Feet Assistive device: Rolling walker (2 wheeled) Gait Pattern/deviations: Step-through pattern;Decreased stride length;Step-to pattern;Trunk flexed Gait velocity: decr   General Gait Details: Min guard for safety, verbal cuing for upright posture, placement in RW, sequencing.  Pt tolerating ltd WB on R LE 2* pain    Stairs             Wheelchair Mobility    Modified Rankin (Stroke Patients Only)       Balance Overall balance assessment: Mild deficits observed, not formally tested                                          Cognition Arousal/Alertness: Awake/alert Behavior During Therapy: WFL for tasks assessed/performed Overall Cognitive Status: Within Functional Limits for tasks assessed                                        Exercises      General Comments        Pertinent Vitals/Pain Pain Assessment: 0-10 Pain Score: 7  Pain Location: R knee  Pain Descriptors / Indicators: Sore Pain Intervention(s): Limited activity within patient's tolerance;Monitored during session;Premedicated before session;Ice applied    Home Living                      Prior Function            PT Goals (current goals can now be found in the care plan section) Acute Rehab PT Goals Patient Stated Goal: go home, return to work at the end of August PT Goal Formulation: With patient Time For Goal Achievement: 11/23/18 Potential to Achieve Goals: Good Progress towards PT goals: Not progressing toward goals - comment(increased pain)    Frequency    7X/week      PT Plan Current plan remains appropriate    Co-evaluation              AM-PAC PT "6 Clicks" Mobility  Outcome Measure  Help needed turning from your back to your side while in a flat bed without using bedrails?: A Little Help needed moving from lying on your back to sitting on the side of a flat bed without using bedrails?: A Little Help needed moving to and from a bed to a chair (including a wheelchair)?: A Little Help needed standing up from a chair using your arms (e.g., wheelchair or bedside chair)?: A Little Help needed to walk in hospital room?: A Little Help needed climbing 3-5 steps with a railing? : A Lot 6 Click Score: 17    End of Session Equipment Utilized During Treatment: Gait belt Activity Tolerance: Patient limited by fatigue;Patient limited by pain Patient left: Other (comment)(up in bathroom - CNA aware) Nurse Communication: Mobility status PT Visit Diagnosis: Other abnormalities of gait  and mobility (R26.89);Difficulty in walking, not elsewhere classified (R26.2)     Time: 2549-8264 PT Time Calculation (min) (ACUTE ONLY): 15 min  Charges:  $Gait Training: 8-22 mins                     [place] Pager [phone] Office [phone]    Areanna Gengler 11/18/2018, 12:22 PM

## 2018-11-18 NOTE — Progress Notes (Signed)
Pt stable with no needs. No signs of distress at bedside rounding with off going rn. No pain to report. rn will continue to monitor.

## 2018-11-18 NOTE — Progress Notes (Signed)
Physical Therapy Treatment Patient Details Name: Joseph Craig MRN: [id number] DOB: 02-08-1946 Today's Date: 11/18/2018    History of Present Illness 73 yo male s/p R TKR on 11/16/18. PMH includes OA, R meniscus tear, HTN.     PT Comments    Pt progressing slowly with mobility - requiring resumption of KI use for ambulation this date 2* increased pain/weakness.  Pt hopeful for dc home tomorrow.  Follow Up Recommendations  Follow surgeon's recommendation for DC plan and follow-up therapies;Supervision for mobility/OOB     Equipment Recommendations  None recommended by PT    Recommendations for Other Services       Precautions / Restrictions Precautions Precautions: Fall Restrictions Weight Bearing Restrictions: No Other Position/Activity Restrictions: WBAT    Mobility  Bed Mobility Overal bed mobility: Needs Assistance Bed Mobility: Supine to Sit;Sit to Supine     Supine to sit: Min assist Sit to supine: Min assist   General bed mobility comments: min cues for sequence and min assist to assist R LE onto bed  Transfers Overall transfer level: Needs assistance Equipment used: Rolling walker (2 wheeled) Transfers: Sit to/from Stand Sit to Stand: Min guard         General transfer comment: Min guard for safety, verbal cuing for hand placement when rising.   Ambulation/Gait Ambulation/Gait assistance: Min assist;Min guard Gait Distance (Feet): 100 Feet Assistive device: Rolling walker (2 wheeled) Gait Pattern/deviations: Step-to pattern;Decreased step length - right;Decreased step length - left;Shuffle;Antalgic;Trunk flexed Gait velocity: decr   General Gait Details: Min guard for safety, verbal cuing for upright posture, placement in RW, sequencing.  Pt tolerating ltd WB on R LE 2* pain    Stairs             Wheelchair Mobility    Modified Rankin (Stroke Patients Only)       Balance Overall balance assessment: Mild deficits observed, not formally  tested                                          Cognition Arousal/Alertness: Awake/alert Behavior During Therapy: WFL for tasks assessed/performed Overall Cognitive Status: Within Functional Limits for tasks assessed                                        Exercises Total Joint Exercises Ankle Circles/Pumps: AROM;Both;20 reps;Supine Quad Sets: AROM;Both;10 reps;Supine Heel Slides: AAROM;Right;15 reps;Supine Straight Leg Raises: AAROM;AROM;Right;Supine;10 reps Goniometric ROM: AAROM R knee -10 - 40 pain limited    General Comments        Pertinent Vitals/Pain Pain Assessment: 0-10 Pain Score: 6  Pain Location: R knee  Pain Descriptors / Indicators: Sore Pain Intervention(s): Limited activity within patient's tolerance;Monitored during session;Premedicated before session;Ice applied    Home Living                      Prior Function            PT Goals (current goals can now be found in the care plan section) Acute Rehab PT Goals Patient Stated Goal: go home, return to work at the end of August PT Goal Formulation: With patient Time For Goal Achievement: 11/23/18 Potential to Achieve Goals: Good Progress towards PT goals: Progressing toward goals    Frequency    7X/week  PT Plan Current plan remains appropriate    Co-evaluation              AM-PAC PT "6 Clicks" Mobility   Outcome Measure  Help needed turning from your back to your side while in a flat bed without using bedrails?: A Little Help needed moving from lying on your back to sitting on the side of a flat bed without using bedrails?: A Little Help needed moving to and from a bed to a chair (including a wheelchair)?: A Little Help needed standing up from a chair using your arms (e.g., wheelchair or bedside chair)?: A Little Help needed to walk in hospital room?: A Little Help needed climbing 3-5 steps with a railing? : A Lot 6 Click Score: 17     End of Session Equipment Utilized During Treatment: Gait belt;Right knee immobilizer Activity Tolerance: Patient tolerated treatment well;Patient limited by fatigue;Patient limited by pain Patient left: in bed;with call bell/phone within reach Nurse Communication: Mobility status PT Visit Diagnosis: Other abnormalities of gait and mobility (R26.89);Difficulty in walking, not elsewhere classified (R26.2)     Time: 6578-4696 PT Time Calculation (min) (ACUTE ONLY): 34 min  Charges:  $Gait Training: 8-22 mins $Therapeutic Exercise: 8-22 mins                     [place] Pager [phone] Office [phone]    Joseph Craig 11/18/2018, 4:41 PM

## 2018-11-18 NOTE — TOC Transition Note (Signed)
Transition of Care [facility]) - CM/SW Discharge Note   Patient Details  Name: Joseph Craig MRN: [id number] Date of Birth: [birth year]  Transition of Care [facility]) CM/SW Contact:  Joaquin Courts, RN Phone Number: 11/18/2018, 9:21 AM   Clinical Narrative:    CM spoke with patient and informed pt that workers comp company is closed over the weekend. CM will follow-up on Monday to find out the company to use for home health and will set up services.  Patient states he has a friend that will lend him RW and 3-in-1 and CM will arrange for equipment to be sent through worker comp on Monday.       Barriers to Discharge: Continued Medical Work up   Patient Goals and CMS Choice Patient states their goals for this hospitalization and ongoing recovery are:: to go home      Discharge Placement                       Discharge Plan and Services   Discharge Planning Services: CM Consult                                 Social Determinants of Health (SDOH) Interventions     Readmission Risk Interventions No flowsheet data found.

## 2018-11-18 NOTE — Progress Notes (Signed)
Physical Therapy Treatment Patient Details Name: Joseph Craig MRN: [id number] DOB: 04/09/1946 Today's Date: 11/18/2018    History of Present Illness 73 yo male s/p R TKR on 11/16/18. PMH includes OA, R meniscus tear, HTN.     PT Comments    Improved pain control and activity tolerance vs this am.  Pt very fatigued and returned to bed.   Follow Up Recommendations  Follow surgeon's recommendation for DC plan and follow-up therapies;Supervision for mobility/OOB     Equipment Recommendations  None recommended by PT    Recommendations for Other Services       Precautions / Restrictions Precautions Precautions: Fall Restrictions Weight Bearing Restrictions: No Other Position/Activity Restrictions: WBAT    Mobility  Bed Mobility Overal bed mobility: Needs Assistance Bed Mobility: Sit to Supine     Supine to sit: Min guard;HOB elevated Sit to supine: Min assist   General bed mobility comments: min cues for sequence and min assist to assist R LE onto bed  Transfers Overall transfer level: Needs assistance Equipment used: Rolling walker (2 wheeled) Transfers: Sit to/from Stand Sit to Stand: Min guard         General transfer comment: Min guard for safety, verbal cuing for hand placement when rising.   Ambulation/Gait Ambulation/Gait assistance: Min assist;Min guard Gait Distance (Feet): 100 Feet Assistive device: Rolling walker (2 wheeled) Gait Pattern/deviations: Step-to pattern;Decreased step length - right;Decreased step length - left;Shuffle;Antalgic;Trunk flexed Gait velocity: decr   General Gait Details: Min guard for safety, verbal cuing for upright posture, placement in RW, sequencing.  Pt tolerating ltd WB on R LE 2* pain    Stairs             Wheelchair Mobility    Modified Rankin (Stroke Patients Only)       Balance Overall balance assessment: Mild deficits observed, not formally tested                                          Cognition Arousal/Alertness: Awake/alert Behavior During Therapy: WFL for tasks assessed/performed Overall Cognitive Status: Within Functional Limits for tasks assessed                                        Exercises      General Comments        Pertinent Vitals/Pain Pain Assessment: 0-10 Pain Score: 5  Pain Location: R knee  Pain Descriptors / Indicators: Sore Pain Intervention(s): Limited activity within patient's tolerance;Monitored during session;Premedicated before session;Ice applied    Home Living                      Prior Function            PT Goals (current goals can now be found in the care plan section) Acute Rehab PT Goals Patient Stated Goal: go home, return to work at the end of August PT Goal Formulation: With patient Time For Goal Achievement: 11/23/18 Potential to Achieve Goals: Good Progress towards PT goals: Progressing toward goals    Frequency    7X/week      PT Plan Current plan remains appropriate    Co-evaluation              AM-PAC PT "6 Clicks" Mobility   Outcome Measure  Help needed turning from your back to your side while in a flat bed without using bedrails?: A Little Help needed moving from lying on your back to sitting on the side of a flat bed without using bedrails?: A Little Help needed moving to and from a bed to a chair (including a wheelchair)?: A Little Help needed standing up from a chair using your arms (e.g., wheelchair or bedside chair)?: A Little Help needed to walk in hospital room?: A Little Help needed climbing 3-5 steps with a railing? : A Lot 6 Click Score: 17    End of Session Equipment Utilized During Treatment: Gait belt;Right knee immobilizer Activity Tolerance: Patient tolerated treatment well;Patient limited by fatigue;Patient limited by pain Patient left: in bed;with call bell/phone within reach Nurse Communication: Mobility status PT Visit Diagnosis: Other  abnormalities of gait and mobility (R26.89);Difficulty in walking, not elsewhere classified (R26.2)     Time: 3903-0092 PT Time Calculation (min) (ACUTE ONLY): 19 min  Charges:  $Gait Training: 8-22 mins                     [place] Pager [phone] Office [phone]    Yoana Staib 11/18/2018, 12:26 PM

## 2018-11-18 NOTE — Progress Notes (Addendum)
   Subjective: 2 Days Post-Op Procedure(s) (LRB): RIGHT TOTAL KNEE ARTHROPLASTY (Right) CYSTOSCOPY FLEXIBLE, ureteral dilation with foley placement (N/A) Patient reports pain as moderate.    Objective: Vital signs in last 24 hours: Temp:  [97.7 F (36.5 C)-98.1 F (36.7 C)] 98 F (36.7 C) (06/07 0516) Pulse Rate:  [60-77] 77 (06/07 0516) Resp:  [18-19] 19 (06/07 0516) BP: (104-150)/(70-90) 150/79 (06/07 0516) SpO2:  [95 %-98 %] 95 % (06/07 0516)  Intake/Output from previous day: 06/06 0701 - 06/07 0700 In: 960 [P.O.:960] Out: 1925 [Urine:1925] Intake/Output this shift: No intake/output data recorded.  Recent Labs    11/17/18 0316 11/18/18 0402  HGB 11.9* 11.8*   Recent Labs    11/17/18 0316 11/18/18 0402  WBC 14.6* 12.7*  RBC 3.79* 3.60*  HCT 37.0* 35.1*  PLT 238 225   Recent Labs    11/17/18 0316  NA 135  K 4.2  CL 102  CO2 26  BUN 12  CREATININE 0.73  GLUCOSE 132*  CALCIUM 8.5*   No results for input(s): LABPT, INR in the last 72 hours.  Neurologically intact No results found.  Assessment/Plan: 2 Days Post-Op Procedure(s) (LRB): RIGHT TOTAL KNEE ARTHROPLASTY (Right) CYSTOSCOPY FLEXIBLE, ureteral dilation with foley placement (N/A) Up with therapy, discharge home .   Needs HHPT arrangements. Home with Foley.  Marybelle Killings 11/18/2018, 9:11 AM

## 2018-11-19 NOTE — TOC Progression Note (Addendum)
Transition of Care [facility]) - Progression Note    Patient Details  Name: Augie Vane MRN: [id number] Date of Birth: 1945-11-02  Transition of Care [facility]) CM/SW [place], [place] Phone Number: 11/19/2018, 9:46 AM  Clinical Narrative:    CSW reached out to workers comp. case manager Karalee Height [phone]. Case Manager requested CSW send orders for Home Health, 3 in 1 and walker and mark as " urgent". CSW routed orders to [phone].   1:45PM CSW reached out to workers comp rep. She inform CSW to contact Agency: [place] [phone] Claim# [id number] to arrange Home Health order, DME Walker Rolling order, DME 3 in 1(Bariatric) order. CSW waiting on a reply.    Expected Discharge Plan: [place] Barriers to Discharge: Continued Medical Work up  Expected Discharge Plan and Services Expected Discharge Plan: [place]   Discharge Planning Services: CM Consult   Living arrangements for the past 2 months: Single Family Home Expected Discharge Date: 11/19/18                                     Social Determinants of Health (SDOH) Interventions    Readmission Risk Interventions No flowsheet data found.

## 2018-11-19 NOTE — Progress Notes (Signed)
Physical Therapy Treatment Patient Details Name: Joseph Craig MRN: [id number] DOB: May 18, 1946 Today's Date: 11/19/2018    History of Present Illness 73 yo male s/p R TKR on 11/16/18. PMH includes OA, R meniscus tear, HTN.     PT Comments    POD # 3 pm session.  Reinforced KI and re educated on need/use as pt could not recall.  "I don't know, they just put it on".  Educated OOB walking/esp stairs until able SLR.  Demonstrated and instructed pt how to use belt to self assist LE as a leg lifter.  Assisted with amb to bathroom.  Unsteady gait with c/o R LE edema.  Noted pitting edema R foot.  Assisted in bathroom with turns and stand to sit.  Placed trash can under R foot as he sat on elevated toilet.  Assisted with amb to stairs however too fatigued so assisted back to bed.  Elevated R LE.  Reported to RN pt has NOT met goals to safeky D/C to home today.  Ortho Tech arrived and placed pt in CPM   Follow Up Recommendations  Follow surgeon's recommendation for DC plan and follow-up therapies;Supervision for mobility/OOB     Equipment Recommendations  Bariatric RW Bariatric 3:1    Recommendations for Other Services       Precautions / Restrictions Precautions Precautions: Fall Restrictions Weight Bearing Restrictions: No Other Position/Activity Restrictions: WBAT    Mobility  Bed Mobility Overal bed mobility: Needs Assistance Bed Mobility: Supine to Sit;Sit to Supine     Supine to sit: Min guard;HOB elevated Sit to supine: Min assist   General bed mobility comments: min cues for sequence and min assist to assist R LE onto bed.  increased difficulty due to ABD girth.  Transfers Overall transfer level: Needs assistance Equipment used: Rolling walker (2 wheeled) Transfers: Sit to/from Stand Sit to Stand: Min assist         General transfer comment: assist to rise and steady, verbal cues for safe technique  also assisted on/off raised  toilet  Ambulation/Gait Ambulation/Gait assistance: Min assist Gait Distance (Feet): 22 Feet(11 feet x 2 to and from bathroom) Assistive device: Rolling walker (2 wheeled) Gait Pattern/deviations: Step-to pattern;Antalgic;Trunk flexed;Decreased step length - right Gait velocity: decreased   General Gait Details: assist for steadying, ambulated short distance to/from bathroom, cues for RW positioning, step length, posture  Unsteady esp with turns   Stairs Stairs: Yes Stairs assistance: Min assist Stair Management: Step to pattern;With walker;Backwards Number of Stairs: 2 General stair comments: verbal cues for sequence, safety, RW positioning, assist for stabilizing, pt with great difficulty performing steps   Wheelchair Mobility    Modified Rankin (Stroke Patients Only)       Balance                                            Cognition Arousal/Alertness: Awake/alert Behavior During Therapy: WFL for tasks assessed/performed Overall Cognitive Status: Within Functional Limits for tasks assessed                                        Exercises Total Joint Exercises Ankle Circles/Pumps: AROM;Both;20 reps;Supine Quad Sets: AROM;Both;10 reps;Supine Heel Slides: AAROM;Right;Supine;10 reps Straight Leg Raises: 10 reps;AROM;Right;Supine    General Comments  Pertinent Vitals/Pain Pain Assessment: 0-10 Pain Score: 6  Pain Location: R knee  Pain Descriptors / Indicators: Sore;Tender;Operative site guarding Pain Intervention(s): Monitored during session;Repositioned;Premedicated before session;Ice applied    Home Living                      Prior Function            PT Goals (current goals can now be found in the care plan section) Progress towards PT goals: Progressing toward goals    Frequency    7X/week      PT Plan Current plan remains appropriate    Co-evaluation              AM-PAC PT "6  Clicks" Mobility   Outcome Measure  Help needed turning from your back to your side while in a flat bed without using bedrails?: A Little Help needed moving from lying on your back to sitting on the side of a flat bed without using bedrails?: A Little Help needed moving to and from a bed to a chair (including a wheelchair)?: A Little Help needed standing up from a chair using your arms (e.g., wheelchair or bedside chair)?: A Little Help needed to walk in hospital room?: A Little Help needed climbing 3-5 steps with a railing? : A Lot 6 Click Score: 17    End of Session Equipment Utilized During Treatment: Gait belt;Right knee immobilizer Activity Tolerance: Patient limited by fatigue;Patient limited by pain Patient left: with call bell/phone within reach;in chair Nurse Communication: Mobility status PT Visit Diagnosis: Other abnormalities of gait and mobility (R26.89);Difficulty in walking, not elsewhere classified (R26.2)     Time: 14:22 - 14:50 PT Time Calculation (min) (ACUTE ONLY): 26 min  Charges:  $Gait Training: 8-22 mins $Therapeutic Activitie: 8-22 mins                     Rica Koyanagi  PTA Acute  Rehabilitation Services Pager      [phone] Office      [phone]

## 2018-11-19 NOTE — Progress Notes (Signed)
Physical Therapy Treatment Patient Details Name: Joseph Craig MRN: [id number] DOB: Jul 26, 1945 Today's Date: 11/19/2018    History of Present Illness 73 yo male s/p R TKR on 11/16/18. PMH includes OA, R meniscus tear, HTN.     PT Comments    Pt performed a few exercise in bed, ambulated short distance, and initiated gait training for stairs.  Pt requiring min assist for ambulation and stairs at this time.   Follow Up Recommendations  Follow surgeon's recommendation for DC plan and follow-up therapies;Supervision for mobility/OOB     Equipment Recommendations  None recommended by PT    Recommendations for Other Services       Precautions / Restrictions Precautions Precautions: Fall Restrictions Other Position/Activity Restrictions: WBAT    Mobility  Bed Mobility Overal bed mobility: Needs Assistance Bed Mobility: Supine to Sit     Supine to sit: Min guard;HOB elevated        Transfers Overall transfer level: Needs assistance Equipment used: Rolling walker (2 wheeled) Transfers: Sit to/from Stand Sit to Stand: Min assist         General transfer comment: assist to rise and steady, verbal cues for safe technique  Ambulation/Gait Ambulation/Gait assistance: Min assist Gait Distance (Feet): 20 Feet Assistive device: Rolling walker (2 wheeled) Gait Pattern/deviations: Step-to pattern;Antalgic;Trunk flexed;Decreased step length - right Gait velocity: decr   General Gait Details: assist for steadying, ambulated short distance to/from stairs, cues for RW positioning, step length, posture   Stairs Stairs: Yes Stairs assistance: Min assist Stair Management: Step to pattern;With walker;Backwards Number of Stairs: 2 General stair comments: verbal cues for sequence, safety, RW positioning, assist for stabilizing, pt with great difficulty performing steps   Wheelchair Mobility    Modified Rankin (Stroke Patients Only)       Balance                                             Cognition Arousal/Alertness: Awake/alert Behavior During Therapy: WFL for tasks assessed/performed Overall Cognitive Status: Within Functional Limits for tasks assessed                                        Exercises Total Joint Exercises Ankle Circles/Pumps: AROM;Both;20 reps;Supine Quad Sets: AROM;Both;10 reps;Supine Heel Slides: AAROM;Right;Supine;10 reps Straight Leg Raises: 10 reps;AROM;Right;Supine    General Comments        Pertinent Vitals/Pain Pain Assessment: 0-10 Pain Score: 6  Pain Location: R knee  Pain Descriptors / Indicators: Sore Pain Intervention(s): Repositioned;Monitored during session;Premedicated before session;Limited activity within patient's tolerance;Ice applied    Home Living                      Prior Function            PT Goals (current goals can now be found in the care plan section) Progress towards PT goals: Progressing toward goals    Frequency    7X/week      PT Plan Current plan remains appropriate    Co-evaluation              AM-PAC PT "6 Clicks" Mobility   Outcome Measure  Help needed turning from your back to your side while in a flat bed without using bedrails?: A Little Help needed  moving from lying on your back to sitting on the side of a flat bed without using bedrails?: A Little Help needed moving to and from a bed to a chair (including a wheelchair)?: A Little Help needed standing up from a chair using your arms (e.g., wheelchair or bedside chair)?: A Little Help needed to walk in hospital room?: A Little Help needed climbing 3-5 steps with a railing? : A Little 6 Click Score: 18    End of Session Equipment Utilized During Treatment: Gait belt;Right knee immobilizer Activity Tolerance: Patient limited by fatigue;Patient limited by pain Patient left: with call bell/phone within reach;in chair   PT Visit Diagnosis: Other abnormalities of gait  and mobility (R26.89);Difficulty in walking, not elsewhere classified (R26.2)     Time: 0940-1006 PT Time Calculation (min) (ACUTE ONLY): 26 min  Charges:  $Gait Training: 8-22 mins $Therapeutic Exercise: 8-22 mins                     Carmelia Bake, PT, DPT Acute Rehabilitation Services Office: [phone] Pager: [phone]  York Ram E 11/19/2018, 1:32 PM

## 2018-11-19 NOTE — TOC Transition Note (Addendum)
Transition of Care [facility]) - CM/SW Discharge Note   Patient Details  Name: Erikson Danzy MRN: [id number] Date of Birth: [birth year]  Transition of Care [facility]) CM/SW Contact:  Lia Hopping, [place] Phone Number: 11/19/2018, 2:07 PM   Clinical Narrative:    Rolm Gala w/  [employer] [phone]) Workers comp. contacted [place] [employer]. CSW provided patient physical address [address] [place], [place] [address] for DME 3 in1 to be delivered to the home. Patient Home Health PT arranged. Patient will be contacted by representative.  Patient claim# [id number].  2:32 PM [place] arranged for the DME agency [employer] representative Zack to deliver DME (Bariatric 3 in 1 and rolling walker) to the patient room today.     Final next level of care: [place] Barriers to Discharge: Continued Medical Work up   Patient Goals and CMS Choice Patient states their goals for this hospitalization and ongoing recovery are:: to go home CMS Medicare.gov Compare Post Acute Care list provided to:: (Workers Comp. [place] services. )    Discharge Placement                Patient to be transferred to facility by: Family    Patient and family notified of of transfer: 11/19/18  Discharge Plan and Services   Discharge Planning Services: CM Consult            DME Arranged: 3-N-1, Walker rolling DME Agency: ([place] ) Date DME Agency Contacted: 11/19/18 Time DME Agency Contacted: [phone] Representative spoke with at DME Agency: Kathy [phone] [facility] Arranged: PT [place]: ([place]) Date [place]: 11/19/18 Time [place]: [phone] Representative spoke with at [place]: [place]  Social Determinants of Health ([place]) Interventions     Readmission Risk Interventions No flowsheet data found.

## 2018-11-19 NOTE — Progress Notes (Signed)
Patient ID: Joseph Craig, male   DOB: 1945/12/31, 73 y.o.   MRN: [id number] Very slow progress with therapy.  Needed to stay an extra day.  He has had some O2 desats, but likely related to his weight, pain meds, etc.  Likely his baseline and we talked about this.  Does have old c-pap at home.  His right knee dressing is changed.  His knee is stable.  Can be discharged to home later today if doing well.

## 2018-11-20 ENCOUNTER — Encounter (HOSPITAL_COMMUNITY): Payer: Self-pay | Admitting: Orthopaedic Surgery

## 2018-11-20 ENCOUNTER — Inpatient Hospital Stay (HOSPITAL_COMMUNITY): Payer: No Typology Code available for payment source

## 2018-11-20 MED ORDER — METOCLOPRAMIDE HCL 5 MG/ML IJ SOLN: 10.0000 mg | Freq: Four times a day (QID) | INTRAMUSCULAR | Status: DC

## 2018-11-20 MED ORDER — METOCLOPRAMIDE HCL 5 MG/ML IJ SOLN
10.0000 mg | Freq: Four times a day (QID) | INTRAMUSCULAR | Status: AC
  Administered 2018-11-20 – 2018-11-21 (×2): 10 mg via INTRAVENOUS
  Filled 2018-11-20 (×2): qty 2

## 2018-11-20 MED ORDER — BISACODYL 10 MG RE SUPP
10.0000 mg | Freq: Once | RECTAL | Status: AC
  Administered 2018-11-20: 10 mg via RECTAL
  Filled 2018-11-20: qty 1

## 2018-11-20 NOTE — Progress Notes (Signed)
During leadership rounds, patient c/o pain in left wrist, limited ROM.  On assessment left wrist is slightly swollen, warm to touch.  Patient states that pain started after using walker with PT.  Ice pack applied, primary RN made aware.

## 2018-11-20 NOTE — Progress Notes (Signed)
Physical Therapy Treatment Patient Details Name: Joseph Craig MRN: [id number] DOB: Oct 16, 1945 Today's Date: 11/20/2018    History of Present Illness 73 yo male s/p R TKR on 11/16/18. PMH includes OA, R meniscus tear, HTN.     PT Comments    POD # 4 am session Pt not progressing as well as expected with issues of pain control and gait instability.  Assisted with amb a limited distance in hallway.  General Gait Details: tolerated a slight increased distance but still limited by pain and decreased WBing thru R LE as well as increased L wrist pain.  Then returned to room to perform some TE's following HEP handout.  Instructed on proper tech, freq as well as use of ICE.   Pt will need another PT session to address stairs.    Follow Up Recommendations  Follow surgeon's recommendation for DC plan and follow-up therapies;Supervision for mobility/OOB     Equipment Recommendations  None recommended by PT    Recommendations for Other Services       Precautions / Restrictions Precautions Precautions: Fall Restrictions Weight Bearing Restrictions: No Other Position/Activity Restrictions: WBAT    Mobility  Bed Mobility Overal bed mobility: Needs Assistance Bed Mobility: Supine to Sit     Supine to sit: Min assist;Mod assist     General bed mobility comments: assist with upper body due to ABD girth  Transfers Overall transfer level: Needs assistance Equipment used: Rolling walker (2 wheeled) Transfers: Sit to/from Stand Sit to Stand: Supervision;Min guard         General transfer comment: assist to rise and steady, verbal cues for safe technique  pt tends to use momentum  Ambulation/Gait Ambulation/Gait assistance: Min guard;Min assist Gait Distance (Feet): 28 Feet Assistive device: Rolling walker (2 wheeled) Gait Pattern/deviations: Step-to pattern;Antalgic;Trunk flexed;Decreased step length - right Gait velocity: decreased   General Gait Details: tolerated a slight  increased distance but still limited by pain and decreased WBing thru R LE as well as increased L wrist pain   Stairs             Wheelchair Mobility    Modified Rankin (Stroke Patients Only)       Balance                                            Cognition Arousal/Alertness: Awake/alert Behavior During Therapy: WFL for tasks assessed/performed Overall Cognitive Status: Within Functional Limits for tasks assessed                                        Exercises   Total Knee Replacement TE's 10 reps B LE ankle pumps 10 reps towel squeezes 05 reps knee presses 05  reps heel slides  10 reps ABD Followed by ICE    General Comments        Pertinent Vitals/Pain Pain Assessment: 0-10 Pain Score: 8  Pain Location: R knee and L wrist (IV site) Pain Descriptors / Indicators: Sore;Tender;Operative site guarding Pain Intervention(s): Monitored during session;Repositioned;Premedicated before session;Ice applied    Home Living                      Prior Function            PT Goals (current goals can  now be found in the care plan section) Progress towards PT goals: Progressing toward goals    Frequency    7X/week      PT Plan Current plan remains appropriate    Co-evaluation              AM-PAC PT "6 Clicks" Mobility   Outcome Measure  Help needed turning from your back to your side while in a flat bed without using bedrails?: A Little Help needed moving from lying on your back to sitting on the side of a flat bed without using bedrails?: A Little Help needed moving to and from a bed to a chair (including a wheelchair)?: A Little Help needed standing up from a chair using your arms (e.g., wheelchair or bedside chair)?: A Little Help needed to walk in hospital room?: A Little Help needed climbing 3-5 steps with a railing? : A Lot 6 Click Score: 17    End of Session Equipment Utilized During  Treatment: Gait belt Activity Tolerance: Patient limited by pain Patient left: in chair;with call bell/phone within reach;with chair alarm set Nurse Communication: Mobility status PT Visit Diagnosis: Other abnormalities of gait and mobility (R26.89);Difficulty in walking, not elsewhere classified (R26.2)     Time: 1000-1028 PT Time Calculation (min) (ACUTE ONLY): 28 min  Charges:  $Gait Training: 8-22 mins $Therapeutic Activity: 8-22 mins                     Rica Koyanagi  PTA Acute  Rehabilitation Services Pager      [phone] Office      [phone]

## 2018-11-20 NOTE — Progress Notes (Signed)
Physical Therapy Treatment Patient Details Name: Joseph Craig MRN: [id number] DOB: 21-Dec-1945 Today's Date: 11/20/2018    History of Present Illness 73 yo male s/p R TKR on 11/16/18. PMH includes OA, R meniscus tear, HTN.     PT Comments    POD # 4 pm session Assisted with amb again in hallway.  Pt having difficulty tolerating WBing thru R LE due to R knee pain despite premedicated.  Attempted stiars but unable to shift weight and off load to step up even one step.   Assisted back to room and applied ICE.  Will see one more time today.   Follow Up Recommendations  Follow surgeon's recommendation for DC plan and follow-up therapies;Supervision for mobility/OOB     Equipment Recommendations  None recommended by PT    Recommendations for Other Services       Precautions / Restrictions Precautions Precautions: Fall Restrictions Weight Bearing Restrictions: No Other Position/Activity Restrictions: WBAT    Mobility  Bed Mobility Overal bed mobility: Needs Assistance Bed Mobility: Supine to Sit     Supine to sit: Min assist;Mod assist     General bed mobility comments: OOB in recliner   Transfers Overall transfer level: Needs assistance Equipment used: Rolling walker (2 wheeled) Transfers: Sit to/from Stand Sit to Stand: Supervision;Min guard         General transfer comment: assist to rise and steady, verbal cues for safe technique  pt tends to use momentum  Ambulation/Gait Ambulation/Gait assistance: Min guard;Min assist Gait Distance (Feet): 32 Feet Assistive device: Rolling walker (2 wheeled) Gait Pattern/deviations: Step-to pattern;Antalgic;Trunk flexed;Decreased step length - right Gait velocity: decreased   General Gait Details: tolerated a slight increased distance but still limited by pain and decreased WBing thru R LE as well as increased L wrist pain   Stairs Stairs: (attempted one step and unable due to pain level)           Wheelchair  Mobility    Modified Rankin (Stroke Patients Only)       Balance                                            Cognition Arousal/Alertness: Awake/alert Behavior During Therapy: WFL for tasks assessed/performed Overall Cognitive Status: Within Functional Limits for tasks assessed                                        Exercises      General Comments        Pertinent Vitals/Pain Pain Assessment: 0-10 Pain Score: 8  Pain Location: R knee and L wrist (IV site) Pain Descriptors / Indicators: Sore;Tender;Operative site guarding Pain Intervention(s): Monitored during session;Repositioned;Premedicated before session;Ice applied    Home Living                      Prior Function            PT Goals (current goals can now be found in the care plan section) Progress towards PT goals: Progressing toward goals    Frequency    7X/week      PT Plan Current plan remains appropriate    Co-evaluation              AM-PAC PT "6 Clicks" Mobility  Outcome Measure  Help needed turning from your back to your side while in a flat bed without using bedrails?: A Little Help needed moving from lying on your back to sitting on the side of a flat bed without using bedrails?: A Little Help needed moving to and from a bed to a chair (including a wheelchair)?: A Little Help needed standing up from a chair using your arms (e.g., wheelchair or bedside chair)?: A Little Help needed to walk in hospital room?: A Little Help needed climbing 3-5 steps with a railing? : A Lot 6 Click Score: 17    End of Session Equipment Utilized During Treatment: Gait belt Activity Tolerance: Patient limited by pain Patient left: in chair;with call bell/phone within reach;with chair alarm set Nurse Communication: Mobility status PT Visit Diagnosis: Other abnormalities of gait and mobility (R26.89);Difficulty in walking, not elsewhere classified (R26.2)      Time: 0223-3612 PT Time Calculation (min) (ACUTE ONLY): 24 min  Charges:  $Gait Training: 8-22 mins $Therapeutic Activity: 8-22 mins                     Rica Koyanagi  PTA Acute  Rehabilitation Services Pager      [phone] Office      [phone]

## 2018-11-20 NOTE — Progress Notes (Signed)
Physical Therapy Treatment Patient Details Name: Joseph Craig MRN: [id number] DOB: [birth year] Today's Date: 11/20/2018    History of Present Illness 73 yo male s/p R TKR on 11/16/18. PMH includes OA, R meniscus tear, HTN.     PT Comments    POD #4 pm pm session Assisted OOB to attempt stairs.  General stair comments: very difficult and near fall as pt attempted stairs.  Great difficulty off loading and weight shifting.   Assisted back to bed and applied CPM 0 - 60 degrees. Reported to RN pt did NOT meet therapy goals for safe D/C to home.   May need to consider non emergency transport to home.     Follow Up Recommendations  Follow surgeon's recommendation for DC plan and follow-up therapies;Supervision for mobility/OOB     Equipment Recommendations  None recommended by PT    Recommendations for Other Services       Precautions / Restrictions Precautions Precautions: Fall Restrictions Weight Bearing Restrictions: No Other Position/Activity Restrictions: WBAT    Mobility  Bed Mobility Overal bed mobility: Needs Assistance Bed Mobility: Supine to Sit     Supine to sit: Min assist;Mod assist     General bed mobility comments: assisted OOB and back to bed and applied CPM 0 - 60 degrees  Transfers Overall transfer level: Needs assistance Equipment used: Rolling walker (2 wheeled) Transfers: Sit to/from Stand Sit to Stand: Supervision;Min guard         General transfer comment: assist to rise and steady, verbal cues for safe technique  pt tends to use momentum  Ambulation/Gait Ambulation/Gait assistance: Min guard;Min assist Gait Distance (Feet): 32 Feet Assistive device: Rolling walker (2 wheeled) Gait Pattern/deviations: Step-to pattern;Antalgic;Trunk flexed;Decreased step length - right Gait velocity: decreased   General Gait Details: tolerated a slight increased distance but still limited by pain and decreased WBing thru R LE as well as increased L wrist  pain   Stairs Stairs: Yes Stairs assistance: Max assist;Total assist Stair Management: No rails;Step to pattern;Backwards;With walker Number of Stairs: 2 General stair comments: very difficult and near fall as pt attempted stairs.  Great difficulty off loading and weight shifting.     Wheelchair Mobility    Modified Rankin (Stroke Patients Only)       Balance                                            Cognition Arousal/Alertness: Awake/alert Behavior During Therapy: WFL for tasks assessed/performed Overall Cognitive Status: Within Functional Limits for tasks assessed                                        Exercises      General Comments        Pertinent Vitals/Pain Pain Assessment: 0-10 Pain Score: 8  Pain Location: R knee and L wrist (IV site) Pain Descriptors / Indicators: Sore;Tender;Operative site guarding Pain Intervention(s): Monitored during session;Repositioned;Premedicated before session;Ice applied    Home Living                      Prior Function            PT Goals (current goals can now be found in the care plan section) Progress towards PT goals: Progressing toward goals  Frequency    7X/week      PT Plan Current plan remains appropriate    Co-evaluation              AM-PAC PT "6 Clicks" Mobility   Outcome Measure  Help needed turning from your back to your side while in a flat bed without using bedrails?: A Little Help needed moving from lying on your back to sitting on the side of a flat bed without using bedrails?: A Little Help needed moving to and from a bed to a chair (including a wheelchair)?: A Little Help needed standing up from a chair using your arms (e.g., wheelchair or bedside chair)?: A Little Help needed to walk in hospital room?: A Little Help needed climbing 3-5 steps with a railing? : A Lot 6 Click Score: 17    End of Session Equipment Utilized During  Treatment: Gait belt Activity Tolerance: Patient limited by pain Patient left: in chair;with call bell/phone within reach;with chair alarm set Nurse Communication: Mobility status PT Visit Diagnosis: Other abnormalities of gait and mobility (R26.89);Difficulty in walking, not elsewhere classified (R26.2)     Time: 0626-9485 PT Time Calculation (min) (ACUTE ONLY): 27 min  Charges:  $Gait Training: 8-22 mins $Therapeutic Activity: 8-22 mins                     Rica Koyanagi  PTA Acute  Rehabilitation Services Pager      [phone] Office      [phone]

## 2018-11-20 NOTE — Progress Notes (Signed)
Patient ID: Joseph Craig, male   DOB: 10-02-1945, 73 y.o.   MRN: [id number] Has had slow progress with therapy and mobility.  Otherwise, he is doing well.  Vitals stable.  Right knee stable.  Can hopefully be discharged to home this afternoon.

## 2018-11-20 NOTE — Plan of Care (Signed)
  Problem: Education: Goal: Knowledge of the prescribed therapeutic regimen will improve Outcome: Progressing Goal: Individualized Educational Video(s) Outcome: Progressing   Problem: Activity: Goal: Ability to avoid complications of mobility impairment will improve Outcome: Progressing Goal: Range of joint motion will improve Outcome: Progressing   Problem: Clinical Measurements: Goal: Postoperative complications will be avoided or minimized Outcome: Progressing   Problem: Pain Management: Goal: Pain level will decrease with appropriate interventions Outcome: Progressing   Problem: Skin Integrity: Goal: Will show signs of wound healing Outcome: Progressing   Problem: Education: Goal: Knowledge of General Education information will improve Description Including pain rating scale, medication(s)/side effects and non-pharmacologic comfort measures Outcome: Progressing

## 2018-11-21 ENCOUNTER — Ambulatory Visit: Payer: Self-pay | Admitting: Orthopaedic Surgery

## 2018-11-21 MED ORDER — POLYVINYL ALCOHOL 1.4 % OP SOLN
1.0000 [drp] | Freq: Four times a day (QID) | OPHTHALMIC | Status: DC | PRN
  Administered 2018-11-21 – 2018-11-22 (×2): 1 [drp] via OPHTHALMIC
  Filled 2018-11-21: qty 15

## 2018-11-21 NOTE — Progress Notes (Signed)
Notified Dr. Ninfa Linden of family's request to make adjustments to the patient's pain meds.  He wishes to maintain the current medication and make changes only if there is noticeable confusion.

## 2018-11-21 NOTE — Progress Notes (Signed)
Physical Therapy Treatment Patient Details Name: Joseph Craig MRN: [id number] DOB: 1945/10/01 Today's Date: 11/21/2018    History of Present Illness 73 yo male s/p R TKR on 11/16/18. PMH includes OA, R meniscus tear, HTN.     PT Comments    POD # 5 am session Extended stay due to developing Ileus. Assisted with amb an increased distance.  Assisted to bathroom for attempted BM.  Then returned to room to perform some TE's following HEP handout.  Instructed on proper tech, freq as well as use of ICE.      Follow Up Recommendations  Follow surgeon's recommendation for DC plan and follow-up therapies;Supervision for mobility/OOB     Equipment Recommendations  None recommended by PT    Recommendations for Other Services       Precautions / Restrictions Precautions Precautions: Fall Restrictions Weight Bearing Restrictions: No Other Position/Activity Restrictions: WBAT    Mobility  Bed Mobility               General bed mobility comments: OOB in recliner  Transfers Overall transfer level: Needs assistance Equipment used: Rolling walker (2 wheeled) Transfers: Sit to/from Stand Sit to Stand: Supervision;Min guard         General transfer comment: assist to rise and steady, verbal cues for safe technique  pt tends to use momentum  Ambulation/Gait Ambulation/Gait assistance: Min guard;Min assist Gait Distance (Feet): 43 Feet Assistive device: Rolling walker (2 wheeled) Gait Pattern/deviations: Step-to pattern;Antalgic;Trunk flexed;Decreased step length - right Gait velocity: decreased   General Gait Details: tolerated a slight increased distance but still limited by pain    Stairs             Wheelchair Mobility    Modified Rankin (Stroke Patients Only)       Balance                                            Cognition Arousal/Alertness: Awake/alert Behavior During Therapy: WFL for tasks assessed/performed Overall Cognitive  Status: Within Functional Limits for tasks assessed                                        Exercises   Total Knee Replacement TE's 10 reps B LE ankle pumps 10 reps towel squeezes 10 reps knee presses 10 reps heel slides  10 reps SAQ's 10 reps SLR's 10 reps ABD Followed by ICE    General Comments        Pertinent Vitals/Pain Pain Assessment: 0-10 Pain Score: 5  Pain Location: R knee Pain Descriptors / Indicators: Sore;Tender;Operative site guarding Pain Intervention(s): Premedicated before session;Monitored during session;Repositioned;Ice applied    Home Living                      Prior Function            PT Goals (current goals can now be found in the care plan section) Progress towards PT goals: Progressing toward goals    Frequency    7X/week      PT Plan Current plan remains appropriate    Co-evaluation              AM-PAC PT "6 Clicks" Mobility   Outcome Measure  Help needed turning from your back to  your side while in a flat bed without using bedrails?: A Little Help needed moving from lying on your back to sitting on the side of a flat bed without using bedrails?: A Little Help needed moving to and from a bed to a chair (including a wheelchair)?: A Little Help needed standing up from a chair using your arms (e.g., wheelchair or bedside chair)?: A Little Help needed to walk in hospital room?: A Little Help needed climbing 3-5 steps with a railing? : A Little 6 Click Score: 18    End of Session Equipment Utilized During Treatment: Gait belt Activity Tolerance: Patient limited by pain Patient left: in chair;with call bell/phone within reach;with chair alarm set Nurse Communication: Mobility status PT Visit Diagnosis: Other abnormalities of gait and mobility (R26.89);Difficulty in walking, not elsewhere classified (R26.2)     Time: 9509-3267 PT Time Calculation (min) (ACUTE ONLY): 41 min  Charges:  $Gait  Training: 8-22 mins $Therapeutic Exercise: 8-22 mins $Therapeutic Activity: 8-22 mins                     Rica Koyanagi  PTA Acute  Rehabilitation Services Pager      [phone] Office      [phone]

## 2018-11-21 NOTE — Progress Notes (Signed)
Spoke with the patient's daughter and son in law after they called with concerns that the patient was 'over-medicated' and 'very confused'.  I attempted to reassure them that he had not had any observed episodes of confusion, and went to his room while on the call with them.  I asked the appropriate questions to assess his mental status and the patient answered all appropriately and showed no s/s of confusion.  They requested someone speak with Dr. Ninfa Linden about the patient's pain meds to request making a change.  I informed them that I would speak with the MD in the morning and that we would continue to monitor the patient for any mental status changes.

## 2018-11-21 NOTE — Progress Notes (Signed)
Patient ID: Joseph Craig, male   DOB: 02-07-1946, 73 y.o.   MRN: [id number] Feels better today.  Still having a difficult time with stairs.  Was developing a post-op ileus, but did have a bowel movement last evening.  He denies nausea and looks good overall.  His abdomen is only slightly distended, but very soft.  His knee is stable.  I will keep him here one more day today to assure that he continues to head in the right direction with his bowels.  Plan on discharge to home tomorrow.

## 2018-11-21 NOTE — Plan of Care (Signed)
Plan of care reviewed and discussed with the patient. 

## 2018-11-21 NOTE — Progress Notes (Signed)
Physical Therapy Treatment Patient Details Name: Joseph Craig MRN: [id number] DOB: 1945-06-24 Today's Date: 11/21/2018    History of Present Illness 73 yo male s/p R TKR on 11/16/18. PMH includes OA, R meniscus tear, HTN.     PT Comments    POD # 5 pm session Extended length of stay due to developing Ileus. Assisted with amb a greater distance.  Assisted to bathroom then back to bed and applied CPM - 65 degrees with ICE. Pt plans to D/C to home tomorrow.    Follow Up Recommendations  Follow surgeon's recommendation for DC plan and follow-up therapies;Supervision for mobility/OOB     Equipment Recommendations  None recommended by PT    Recommendations for Other Services       Precautions / Restrictions Precautions Precautions: Fall Restrictions Weight Bearing Restrictions: No Other Position/Activity Restrictions: WBAT    Mobility  Bed Mobility Overal bed mobility: Needs Assistance Bed Mobility: Sit to Supine       Sit to supine: Min assist   General bed mobility comments: assist LE up onto bed   Transfers Overall transfer level: Needs assistance Equipment used: Rolling walker (2 wheeled) Transfers: Sit to/from Stand Sit to Stand: Supervision;Min guard         General transfer comment: assist to rise and steady, verbal cues for safe technique  pt tends to use momentum  Ambulation/Gait Ambulation/Gait assistance: Min guard;Min assist Gait Distance (Feet): 55 Feet Assistive device: Rolling walker (2 wheeled) Gait Pattern/deviations: Step-to pattern;Antalgic;Trunk flexed;Decreased step length - right Gait velocity: decreased   General Gait Details: tolerated a slight increased distance but still limited by pain    Stairs             Wheelchair Mobility    Modified Rankin (Stroke Patients Only)       Balance                                            Cognition Arousal/Alertness: Awake/alert Behavior During Therapy: WFL  for tasks assessed/performed Overall Cognitive Status: Within Functional Limits for tasks assessed                                        Exercises  10 reps AP and HS    General Comments        Pertinent Vitals/Pain Pain Assessment: 0-10 Pain Score: 5  Pain Location: R knee Pain Descriptors / Indicators: Sore;Tender;Operative site guarding Pain Intervention(s): Premedicated before session;Monitored during session;Repositioned;Ice applied    Home Living                      Prior Function            PT Goals (current goals can now be found in the care plan section) Progress towards PT goals: Progressing toward goals    Frequency    7X/week      PT Plan Current plan remains appropriate    Co-evaluation              AM-PAC PT "6 Clicks" Mobility   Outcome Measure  Help needed turning from your back to your side while in a flat bed without using bedrails?: A Little Help needed moving from lying on your back to sitting on the side of a  flat bed without using bedrails?: A Little Help needed moving to and from a bed to a chair (including a wheelchair)?: A Little Help needed standing up from a chair using your arms (e.g., wheelchair or bedside chair)?: A Little Help needed to walk in hospital room?: A Little Help needed climbing 3-5 steps with a railing? : A Little 6 Click Score: 18    End of Session Equipment Utilized During Treatment: Gait belt Activity Tolerance: Patient limited by pain Patient left: in bed;with call bell/phone within reach;with bed alarm set Nurse Communication: Mobility status PT Visit Diagnosis: Other abnormalities of gait and mobility (R26.89);Difficulty in walking, not elsewhere classified (R26.2)     Time: 1240-1310 PT Time Calculation (min) (ACUTE ONLY): 30 min  Charges:  $Gait Training: 8-22 mins $Therapeutic Activity: 8-22 mins                     Rica Koyanagi  PTA Acute  Rehabilitation  Services Pager      [phone] Office      [phone]

## 2018-11-22 ENCOUNTER — Other Ambulatory Visit: Payer: Self-pay | Admitting: Orthopaedic Surgery

## 2018-11-22 MED ORDER — RIVAROXABAN 10 MG PO TABS: 10.0000 mg | ORAL_TABLET | Freq: Every day | ORAL | 0 refills | Status: DC

## 2018-11-22 MED ORDER — METHOCARBAMOL 500 MG PO TABS: 500.0000 mg | ORAL_TABLET | Freq: Four times a day (QID) | ORAL | 0 refills | Status: DC | PRN

## 2018-11-22 MED ORDER — OXYCODONE HCL 5 MG PO TABS: 5.0000 mg | ORAL_TABLET | Freq: Four times a day (QID) | ORAL | 0 refills | Status: DC | PRN

## 2018-11-22 NOTE — Progress Notes (Signed)
Patient ID: Joseph Craig, male   DOB: August 13, 1945, 73 y.o.   MRN: [id number] Continues to improve overall.  Abdomen soft.  Vitals stable.  Right knee stable.  Mobilizing better.  Has had BM.  Foley cath still in.  I spoke with Urology and they recommend a voiding trial this am.  If unable to void, then nursing should attempt to place the foley back one time.  If able to void, can discharge to home later today.

## 2018-11-22 NOTE — Progress Notes (Signed)
Physical Therapy Treatment Patient Details Name: Joseph Craig MRN: [id number] DOB: [birth year] Today's Date: 11/22/2018    History of Present Illness R TKR post op developing Ileus    PT Comments    POD # 6  Assisted with amb a greater distance and practiced stairs.  Then returned to room to perform some TE's following HEP handout.  Instructed on proper tech, freq as well as use of ICE.  Pt instructed on R LE elevation and use of TEDS.  Addressed all mobility questions, discussed appropriate activity, educated on use of ICE.  Pt ready for D/C to home.    Follow Up Recommendations  Home health PT     Equipment Recommendations  Rolling walker with 5" wheels;3in1 (PT)    Recommendations for Other Services       Precautions / Restrictions Precautions Precautions: None    Mobility  Bed Mobility               General bed mobility comments: OOB in recliner  Transfers Overall transfer level: Needs assistance Equipment used: Rolling walker (2 wheeled) Transfers: Sit to/from Stand Sit to Stand: Supervision         General transfer comment: one VC safety with turn completion  Ambulation/Gait Ambulation/Gait assistance: Supervision Gait Distance (Feet): 65 Feet Assistive device: Rolling walker (2 wheeled) Gait Pattern/deviations: Decreased stance time - right;Step-through pattern Gait velocity: decreased   General Gait Details: VC's to increase heel strike and WBing R LE   Stairs   Stairs assistance: Min assist Stair Management: No rails;Step to pattern;Backwards;With walker Number of Stairs: 2 General stair comments: 25% VC's on proper tech and safety as well as walker placement.  Used pt's personal cell phone to video for family education.   Wheelchair Mobility    Modified Rankin (Stroke Patients Only)       Balance                                            Cognition   Behavior During Therapy: WFL for tasks  assessed/performed Overall Cognitive Status: Within Functional Limits for tasks assessed                                        Exercises   Total Knee Replacement TE's 10 reps B LE ankle pumps 10 reps towel squeezes 10 reps knee presses  Followed by ICE     General Comments        Pertinent Vitals/Pain Pain Assessment: 0-10 Pain Score: 5  Pain Location: R knee Pain Descriptors / Indicators: Tightness;Discomfort Pain Intervention(s): Monitored during session;Repositioned;Ice applied    Home Living                      Prior Function            PT Goals (current goals can now be found in the care plan section) Progress towards PT goals: Progressing toward goals    Frequency           PT Plan Current plan remains appropriate    Co-evaluation              AM-PAC PT "6 Clicks" Mobility   Outcome Measure  End of Session Equipment Utilized During Treatment: Gait belt Activity Tolerance: Patient tolerated treatment well Patient left: in chair         Time: 1104-1130 PT Time Calculation (min) (ACUTE ONLY): 26 min  Charges:  $Gait Training: 8-22 mins $Therapeutic Activity: 8-22 mins                     Rica Koyanagi  PTA Acute  Rehabilitation Services Pager      [phone] Office      [phone]

## 2018-11-22 NOTE — TOC Progression Note (Signed)
Transition of Care [facility]) - Progression Note    Patient Details  Name: Addis Tuohy MRN: [id number] Date of Birth: 10-04-1945  Transition of [facility]) CM/SW Contact  Servando Snare, [place] Phone Number: 11/22/2018, 2:32 PM  Clinical Narrative: LCSW spoke with worker's comp case manager, Estill Bamberg who is still seeking a [place] agency that will accept patient. So far she has been unsuccessful.  Barriers to dc: Agency out of area  Agency doesn't have staffing to meet the needs of patient  Worker's comp CM is working on case along with her supervisor. LCSW will continue to follow for dc needs.        Expected Discharge Plan: [place] Barriers to Discharge: Continued Medical Work up  Expected Discharge Plan and Services Expected Discharge Plan: [place]   Discharge Planning Services: CM Consult   Living arrangements for the past 2 months: Single Family Home Expected Discharge Date: 11/22/18               DME Arranged: Berta Minor rolling DME Agency: ([place] ) Date DME Agency Contacted: 11/19/18 Time DME Agency Contacted: [phone] Representative spoke with at DME Agency: Kathy [phone] [place]: PT [place]: ([place]) Date [place]: 11/19/18 Time [place]: 1405 Representative spoke with at [place]: [place]   Social Determinants of Health ([place]) Interventions    Readmission Risk Interventions No flowsheet data found.

## 2018-11-22 NOTE — Discharge Summary (Signed)
Patient ID: Joseph Craig MRN: [id number] DOB/AGE: 12-10-1945 73 y.o.  Admit date: 11/16/2018 Discharge date: 11/22/2018  Admission Diagnoses:  Principal Problem:   Unilateral primary osteoarthritis, right knee Active Problems:   Status post total right knee replacement   Discharge Diagnoses:  Same  Past Medical History:  Diagnosis Date  . Arthritis   . Cancer ([place])    Skin-Precancerous tag removed  . Hypertension   . Sinus congestion     Surgeries: Procedure(s): RIGHT TOTAL KNEE ARTHROPLASTY CYSTOSCOPY FLEXIBLE, ureteral dilation with foley placement on 11/16/2018   Consultants:   Discharged Condition: Improved  Hospital Course: Joseph Craig is an 73 y.o. male who was admitted 11/16/2018 for operative treatment ofUnilateral primary osteoarthritis, right knee. Patient has severe unremitting pain that affects sleep, daily activities, and work/hobbies. After pre-op clearance the patient was taken to the operating room on 11/16/2018 and underwent  Procedure(s): RIGHT TOTAL KNEE ARTHROPLASTY CYSTOSCOPY FLEXIBLE, ureteral dilation with foley placement.    Patient was given perioperative antibiotics:  Anti-infectives (From admission, onward)   Start     Dose/Rate Route Frequency Ordered Stop   11/16/18 1500  clindamycin (CLEOCIN) IVPB 600 mg     600 mg 100 mL/hr over 30 Minutes Intravenous Every 6 hours 11/16/18 1304 11/16/18 2032   11/16/18 0715  clindamycin (CLEOCIN) IVPB 900 mg     900 mg 100 mL/hr over 30 Minutes Intravenous  Once 11/16/18 0702 11/16/18 0904   11/16/18 0703  clindamycin (CLEOCIN) 900 MG/50ML IVPB    Note to Pharmacy: Randa Evens  : cabinet override      11/16/18 0703 11/16/18 0854   11/16/18 0630  ceFAZolin (ANCEF) IVPB 2g/100 mL premix  Status:  Discontinued     2 g 200 mL/hr over 30 Minutes Intravenous On call to O.R. 11/16/18 0615 11/16/18 0701       Patient was given sequential compression devices, early ambulation, and chemoprophylaxis to  prevent DVT.  Patient benefited maximally from hospital stay.  His hospital stay was prolonged secondary to a minimal postoperative ileus.  This resolved with a bowel regimen combined with mobility and Reglan.  He did not require any other intervention for this.  He also had a Foley catheter that stayed in due to difficult placement from strictures in the operating room.  This was pulled the day of discharge and he was able to have a successful voiding trial.  Recent vital signs:  Patient Vitals for the past 24 hrs:  BP Temp Temp src Pulse Resp SpO2  11/22/18 0505 (!) 146/78 97.9 F (36.6 C) Oral 79 14 92 %  11/21/18 2102 121/67 98.2 F (36.8 C) Oral 78 18 96 %     Recent laboratory studies: No results for input(s): WBC, HGB, HCT, PLT, NA, K, CL, CO2, BUN, CREATININE, GLUCOSE, INR, CALCIUM in the last 72 hours.  Invalid input(s): PT, 2   Discharge Medications:   Allergies as of 11/22/2018      Reactions   Penicillins Anaphylaxis   Did it involve swelling of the face/tongue/throat, SOB, or low BP? Yes Did it involve sudden or severe rash/hives, skin peeling, or any reaction on the inside of your mouth or nose? No Did you need to seek medical attention at a hospital or doctor's office? Yes When did it last happen?childhood If all above answers are "NO", may proceed with cephalosporin use.      Medication List    TAKE these medications   amLODipine 5 MG tablet Commonly known as: NORVASC  Take 5 mg by mouth daily.   aspirin 81 MG chewable tablet Chew by mouth daily.   azithromycin 250 MG tablet Commonly known as: ZITHROMAX Take 250 mg by mouth daily.   fexofenadine-pseudoephedrine 180-240 MG 24 hr tablet Commonly known as: ALLEGRA-D 24 Take 1 tablet by mouth daily.   fluticasone 50 MCG/ACT nasal spray Commonly known as: FLONASE Place 1 spray into both nostrils daily.   lisinopril 40 MG tablet Commonly known as: ZESTRIL Take 40 mg by mouth 2 (two) times a day.    meloxicam 7.5 MG tablet Commonly known as: MOBIC TAKE 1 TABLET (7.5 MG TOTAL) BY MOUTH 2 (TWO) TIMES DAILY BETWEEN MEALS AS NEEDED FOR PAIN. What changed:   when to take this  reasons to take this   methocarbamol 500 MG tablet Commonly known as: ROBAXIN Take 1 tablet (500 mg total) by mouth every 6 (six) hours as needed for muscle spasms.   oxyCODONE 5 MG immediate release tablet Commonly known as: Oxy IR/ROXICODONE Take 1-2 tablets (5-10 mg total) by mouth every 6 (six) hours as needed for moderate pain (pain score 4-6).   rivaroxaban 10 MG Tabs tablet Commonly known as: XARELTO Take 1 tablet (10 mg total) by mouth daily with breakfast.   tamsulosin 0.4 MG Caps capsule Commonly known as: FLOMAX Take 0.4 mg by mouth at bedtime.   triamterene-hydrochlorothiazide 37.5-25 MG tablet Commonly known as: MAXZIDE-25 Take 1 tablet by mouth every morning.            Durable Medical Equipment  (From admission, onward)         Start     Ordered   11/16/18 1305  DME 3 n 1  Once     11/16/18 1304   11/16/18 1305  DME Walker rolling  Once    Question:  Patient needs a walker to treat with the following condition  Answer:  Status post total right knee replacement   11/16/18 1304          Diagnostic Studies: Dg Abd 1 View  Result Date: 11/20/2018 CLINICAL DATA:  Abdominal distension. EXAM: ABDOMEN - 1 VIEW COMPARISON:  None. FINDINGS: Dilated air-filled right and transverse colon is noted. No definite small bowel dilatation is noted. Rectal tube is noted. No radio-opaque calculi or other significant radiographic abnormality are seen. IMPRESSION: Dilated air-filled right and transverse colon is noted concerning for possible ileus. Electronically Signed   By: Marijo Conception M.D.   On: 11/20/2018 15:28   Dg Knee Right Port  Result Date: 11/16/2018 CLINICAL DATA:  Total right knee arthroplasty EXAM: PORTABLE RIGHT KNEE - 1-2 VIEW COMPARISON:  Radiographs 07/23/2018 FINDINGS: Well  seated components of a total knee arthroplasty. No complicating features are identified. IMPRESSION: Well seated components of a total knee arthroplasty. Electronically Signed   By: Marijo Sanes M.D.   On: 11/16/2018 12:21    Disposition: Discharge disposition: 01-Home or Self Care         Follow-up Information    Mcarthur Rossetti, MD. Schedule an appointment as soon as possible for a visit in 2 week(s).   Specialty: Orthopedic Surgery Contact information: [place] [address] [facility].   Why: Case manager will follow-up woth your worker's comp to set up home health physical therapy services. once set up the agency will contat you for initial visit time.       [name], MD On 12/11/2018.  Specialty: Urology Why: for follow up urethral stricture Contact information: [place] [address] [phone]            Signed: Mcarthur Rossetti 11/22/2018, 4:59 PM

## 2018-11-23 ENCOUNTER — Telehealth: Payer: Self-pay

## 2018-11-23 NOTE — Progress Notes (Addendum)
CSW received a call from Rep. Estill Bamberg [phone] w/ [employer] workers compensation agency. She is still attempting to arrange home health with WC adjuster. The patient has been declined by 18 agencies She reports if they are unable to find Home Health agency they will call Dr. Ninfa Linden office to arrange outpatient therapy. CSW left a message for Dr. Ninfa Linden, he is currently in the OR.   3:53pm CSW discussed with Dr. Ninfa Linden.  CSW followed up again w/ Rep Estill Bamberg. She reports she has left messages for the adjuster, still unable to reach. Estill Bamberg to follow up with the patient.    Joseph Craig, Joseph Craig, MSW Clinical Social Worker  [phone] 11/23/2018  12:56 PM

## 2018-11-23 NOTE — Telephone Encounter (Signed)
Patient's daughter Anderson Malta called concerning patient's WC and HHN.  Henrene Dodge, per Ander Purpura F.that patient would need to contact his [facility] case manager concerning HHN. Stated that she understands and would advise patient.  Cb# is [phone].

## 2018-11-26 ENCOUNTER — Telehealth: Payer: Self-pay

## 2018-11-26 NOTE — Telephone Encounter (Signed)
Nickie with [facility] would like to know if patient can do outpatient therapy instead of having a HHN to come to patient's home?   Stated that some of the agencies are not in the area of the patient's home or they don't have anyone available at this time. Patient is WC.  CB# is [phone].  Please advise.  Thank you.

## 2018-11-26 NOTE — Telephone Encounter (Signed)
Do I fax it somewhere or how do you/they need  It?

## 2018-11-26 NOTE — Telephone Encounter (Signed)
Nicki called back,left me vm stating their vendor has contacted 17 different home health agencies and they don't have any one that can go out. She said she herself has called and is having no luck either. Wants to know if there is a certain place Dr. Ninfa Linden prefers and if so will need an updated order with their info. Per Caryl Pina it would be [facility]. Tried to call her back and again no answer, no vm, then busy signal so I emailed her again with this info and for her to let me know if this is one of the places they had contacted.

## 2018-11-26 NOTE — Telephone Encounter (Signed)
I've called this number and it isn't working, can you email them and tell them he needs HHPT, not out patient. He just had surgery and can't even drive yet. And they need to go out there ASAP thanks

## 2018-11-26 NOTE — Telephone Encounter (Signed)
Faxed to number provided

## 2018-11-26 NOTE — Telephone Encounter (Signed)
Work comp needs order for home health so they can get this set up for him

## 2018-11-28 ENCOUNTER — Other Ambulatory Visit: Payer: Self-pay

## 2018-11-28 DIAGNOSIS — Z96651 Presence of right artificial knee joint: Secondary | ICD-10-CM

## 2018-11-28 NOTE — Telephone Encounter (Signed)
It's in.  

## 2018-11-28 NOTE — Telephone Encounter (Signed)
Emailed rx to [facility] @ [place] and asked her to keep me posted as to when he is scheduled

## 2018-11-28 NOTE — Telephone Encounter (Signed)
Out patient therapy in [facility] is where he would like to go for outpatient His daughter will take him But they need to get him in ASAP!!

## 2018-11-29 ENCOUNTER — Ambulatory Visit (INDEPENDENT_AMBULATORY_CARE_PROVIDER_SITE_OTHER): Payer: No Typology Code available for payment source | Admitting: Physician Assistant

## 2018-11-29 ENCOUNTER — Encounter: Payer: Self-pay | Admitting: Physician Assistant

## 2018-11-29 ENCOUNTER — Other Ambulatory Visit: Payer: Self-pay

## 2018-11-29 ENCOUNTER — Telehealth: Payer: Self-pay | Admitting: Orthopaedic Surgery

## 2018-11-29 DIAGNOSIS — Z96651 Presence of right artificial knee joint: Secondary | ICD-10-CM

## 2018-11-29 NOTE — Progress Notes (Signed)
HPI: [name] returns today 13 days status post right total knee arthroplasty.  He is overall doing well.  Is been taking Xarelto for DVT prophylaxis.  Unfortunately he has had no home health PT.  He does have outpatient physical therapy set up to begin soon at [facility].  He has had no fevers chills joint denies chest pain.  He is taking periodic oxycodone and Tylenol for pain.  Physical exam: General well-developed well-nourished male no acute distress.  Mood affect appropriate.  Right knee: Surgical incisions well approximated with staples no signs of infection.  Calf supple nontender.  He has full extension of the right knee flexion to 85 degrees.  Dorsiflexion plantarflexion ankle intact.  Calf supple nontender.  Impression: Status post right total knee arthroplasty 11/16/2018  Plan: Staples removed Steri-Strips were applied.  He is able to get the incision wet in shower.  No submerging of the wound.  Scar tissue mobilization encouraged.  He will begin physical therapy at the outpatient patient facility [facility].  He has any issues in getting into therapy will contact our office immediately.  Follow-up with [place] in 1 month sooner if there is any questions concerns.  He will go back on his 81 mg aspirin after he finishes with his Xarelto.  Questions encouraged and answered.

## 2018-11-29 NOTE — Telephone Encounter (Signed)
Last ov note and Op note faxed to [facility] [phone], ph [phone]

## 2018-12-10 ENCOUNTER — Telehealth: Payer: Self-pay

## 2018-12-10 NOTE — Telephone Encounter (Signed)
Faxed the 11/29/18 office note and work note to the wc adj per her request

## 2018-12-27 ENCOUNTER — Ambulatory Visit (INDEPENDENT_AMBULATORY_CARE_PROVIDER_SITE_OTHER): Payer: No Typology Code available for payment source | Admitting: Physician Assistant

## 2018-12-27 ENCOUNTER — Encounter: Payer: Self-pay | Admitting: Physician Assistant

## 2018-12-27 ENCOUNTER — Other Ambulatory Visit: Payer: Self-pay

## 2018-12-27 DIAGNOSIS — Z96651 Presence of right artificial knee joint: Secondary | ICD-10-CM

## 2018-12-27 NOTE — Progress Notes (Signed)
HPI: [name] returns now 41 days status post right total knee arthroplasty.  States overall he is doing well.  He does note some swelling in the leg.  He has had no fevers chills.  He feels that therapy is definitely helping.  He was delayed in starting therapy week due to [employer]. making arrangements with therapy.  He also reports he had an episode of hypotension had been to the ER felt that his Robaxin was causing his hypotension and he stopped this.  His lisinopril was also reduced.  Review of systems: Please see HPI otherwise negative  Physical exam: Right knee surgical incisions well-healed he has full extension flexion to 90 degrees.  Calf supple nontender.  Ambulates without any assistive device.  Impression: Status post right total knee arthroplasty 11/16/2018  Plan: We will continue work with therapy on range of motion strengthening.  Particularly work on flexion.  Continue work on scar tissue mobilization.  He is out of work at this point in time.  Will reevaluate his physical therapy and work status at next office visit in a month sooner if there is any questions or concerns.

## 2018-12-29 ENCOUNTER — Other Ambulatory Visit: Payer: Self-pay | Admitting: Orthopaedic Surgery

## 2018-12-31 NOTE — Telephone Encounter (Signed)
He does not need to be on this anymore.

## 2018-12-31 NOTE — Telephone Encounter (Signed)
Ok to rf? 

## 2019-01-02 ENCOUNTER — Telehealth: Payer: Self-pay | Admitting: Orthopaedic Surgery

## 2019-01-02 NOTE — Telephone Encounter (Signed)
Patient wants to know if he can drive

## 2019-01-02 NOTE — Telephone Encounter (Signed)
Please advise. Patient is status post right total knee arthroplasty 11/16/2018.

## 2019-01-02 NOTE — Telephone Encounter (Signed)
He can drive from our standpoint.

## 2019-01-03 NOTE — Telephone Encounter (Signed)
I called patient and advised. 

## 2019-01-04 ENCOUNTER — Telehealth: Payer: Self-pay

## 2019-01-04 NOTE — Telephone Encounter (Signed)
Holding to complete and get signature from Dr Ninfa Linden.

## 2019-01-04 NOTE — Telephone Encounter (Signed)
Helene Kelp with [facility] called stating that patient needs an order from our office for patient to continue PT for 2-3 weeks for 4 more weeks with frequency and duration.  Fax order to [phone] attn:Teresa.  Cb# is [phone].  Please advise.  Thank you.

## 2019-01-07 ENCOUNTER — Telehealth: Payer: Self-pay

## 2019-01-07 NOTE — Telephone Encounter (Signed)
Unless he has had a blood clot which I do not think he has he does not need to be on this medication anymore.

## 2019-01-07 NOTE — Telephone Encounter (Signed)
Faxed order to provided number  ?

## 2019-01-07 NOTE — Telephone Encounter (Signed)
Faxed the 12/27/18 office note and work note to wc adj Karalee Height @ [place] Fax# [phone]

## 2019-01-07 NOTE — Telephone Encounter (Signed)
xarelto refill [facility]

## 2019-01-31 ENCOUNTER — Ambulatory Visit (INDEPENDENT_AMBULATORY_CARE_PROVIDER_SITE_OTHER): Payer: No Typology Code available for payment source | Admitting: Physician Assistant

## 2019-01-31 ENCOUNTER — Other Ambulatory Visit: Payer: Self-pay

## 2019-01-31 ENCOUNTER — Encounter: Payer: Self-pay | Admitting: Physician Assistant

## 2019-01-31 DIAGNOSIS — Z96651 Presence of right artificial knee joint: Secondary | ICD-10-CM

## 2019-01-31 DIAGNOSIS — M24661 Ankylosis, right knee: Secondary | ICD-10-CM

## 2019-01-31 NOTE — Progress Notes (Signed)
HPI: [name] returns today 76 days status post right total knee arthroplasty.  He is working with therapy but usually cannot get beyond 90 degrees.The back to work on 31st of this month he really feels like he cannot.  He still having significant swelling in the leg.  Said no chest pain shortness of breath calf pain.  Physical exam: Right knee full extension flexion to 90 degrees both active and passive.  Calf supple nontender.  No instability valgus varus stressing the surgical incisions well-healed.  Impression: Right knee arthrofibrosis Right total knee arthroplasty 11/16/2018  Plan: Due to the fact that he cannot get beyond 90 degrees recommend right knee manipulation under anesthesia.  He like to proceed with this in the near future.  He understands he will not return to physical therapy after that his pain with range of motion.  We will keep him out of work until November 2nd.  We will see him back 2 weeks post.  We will need to get approval from Workmen's Comp. prior to proceeding with surgery and he understands this.

## 2019-02-01 ENCOUNTER — Telehealth: Payer: Self-pay

## 2019-02-01 NOTE — Telephone Encounter (Signed)
Called [place] at [phone] to let her know that patient will continue out of work until Nov 2.

## 2019-02-12 ENCOUNTER — Other Ambulatory Visit: Payer: Self-pay | Admitting: Physician Assistant

## 2019-02-13 ENCOUNTER — Encounter (HOSPITAL_BASED_OUTPATIENT_CLINIC_OR_DEPARTMENT_OTHER): Payer: Self-pay | Admitting: *Deleted

## 2019-02-13 ENCOUNTER — Other Ambulatory Visit: Payer: Self-pay

## 2019-02-14 NOTE — Progress Notes (Signed)

## 2019-02-19 ENCOUNTER — Other Ambulatory Visit: Payer: Self-pay

## 2019-02-19 ENCOUNTER — Encounter (HOSPITAL_BASED_OUTPATIENT_CLINIC_OR_DEPARTMENT_OTHER)
Admission: RE | Admit: 2019-02-19 | Discharge: 2019-02-19 | Disposition: A | Discharge status: Home / Self Care | Payer: Medicare Other | Source: Ambulatory Visit | Attending: Surgery | Admitting: Surgery

## 2019-02-19 ENCOUNTER — Other Ambulatory Visit (HOSPITAL_COMMUNITY)
Admission: RE | Admit: 2019-02-19 | Discharge: 2019-02-19 | Disposition: A | Discharge status: Home / Self Care | Payer: Medicare Other | Source: Ambulatory Visit | Attending: Orthopaedic Surgery | Admitting: Orthopaedic Surgery

## 2019-02-19 DIAGNOSIS — Z01812 Encounter for preprocedural laboratory examination: Secondary | ICD-10-CM | POA: Diagnosis present

## 2019-02-19 DIAGNOSIS — Z20828 Contact with and (suspected) exposure to other viral communicable diseases: Secondary | ICD-10-CM | POA: Diagnosis not present

## 2019-02-19 LAB — BASIC METABOLIC PANEL
Anion gap: 9 (ref 5–15)
BUN: 11 mg/dL (ref 8–23)
CO2: 29 mmol/L (ref 22–32)
Calcium: 9.1 mg/dL (ref 8.9–10.3)
Chloride: 102 mmol/L (ref 98–111)
Creatinine, Ser: 1.13 mg/dL (ref 0.61–1.24)
GFR calc Af Amer: >60 mL/min (ref >60)
GFR calc non Af Amer: >60 mL/min (ref >60)
Glucose, Bld: 121 mg/dL — ABNORMAL HIGH (ref 70–99)
Potassium: 4.2 mmol/L (ref 3.5–5.1)
Sodium: 140 mmol/L (ref 135–145)

## 2019-02-19 LAB — SARS CORONAVIRUS 2 (TAT 6-24 HRS): SARS Coronavirus 2: NEGATIVE

## 2019-02-21 ENCOUNTER — Ambulatory Visit (HOSPITAL_BASED_OUTPATIENT_CLINIC_OR_DEPARTMENT_OTHER): Payer: No Typology Code available for payment source | Admitting: Certified Registered"

## 2019-02-21 ENCOUNTER — Encounter (HOSPITAL_BASED_OUTPATIENT_CLINIC_OR_DEPARTMENT_OTHER): Payer: Self-pay

## 2019-02-21 ENCOUNTER — Other Ambulatory Visit: Payer: Self-pay | Admitting: Orthopaedic Surgery

## 2019-02-21 ENCOUNTER — Encounter (HOSPITAL_BASED_OUTPATIENT_CLINIC_OR_DEPARTMENT_OTHER)
Admission: RE | Disposition: A | Discharge status: Home / Self Care | Payer: Self-pay | Source: Home / Self Care | Attending: Orthopaedic Surgery

## 2019-02-21 ENCOUNTER — Other Ambulatory Visit: Payer: Self-pay

## 2019-02-21 ENCOUNTER — Ambulatory Visit (HOSPITAL_BASED_OUTPATIENT_CLINIC_OR_DEPARTMENT_OTHER)
Admission: RE | Admit: 2019-02-21 | Discharge: 2019-02-21 | Disposition: A | Discharge status: Home / Self Care | Payer: No Typology Code available for payment source | Attending: Orthopaedic Surgery | Admitting: Orthopaedic Surgery

## 2019-02-21 DIAGNOSIS — Z96651 Presence of right artificial knee joint: Secondary | ICD-10-CM | POA: Insufficient documentation

## 2019-02-21 DIAGNOSIS — I10 Essential (primary) hypertension: Secondary | ICD-10-CM | POA: Diagnosis not present

## 2019-02-21 DIAGNOSIS — T8482XD Fibrosis due to internal orthopedic prosthetic devices, implants and grafts, subsequent encounter: Secondary | ICD-10-CM

## 2019-02-21 DIAGNOSIS — Z6841 Body Mass Index (BMI) 40.0 and over, adult: Secondary | ICD-10-CM | POA: Diagnosis not present

## 2019-02-21 DIAGNOSIS — Z79899 Other long term (current) drug therapy: Secondary | ICD-10-CM | POA: Diagnosis not present

## 2019-02-21 DIAGNOSIS — M24661 Ankylosis, right knee: Secondary | ICD-10-CM | POA: Diagnosis not present

## 2019-02-21 DIAGNOSIS — T8482XA Fibrosis due to internal orthopedic prosthetic devices, implants and grafts, initial encounter: Secondary | ICD-10-CM

## 2019-02-21 DIAGNOSIS — Z7982 Long term (current) use of aspirin: Secondary | ICD-10-CM | POA: Insufficient documentation

## 2019-02-21 HISTORY — DX: Ankylosis, right knee: M24.661

## 2019-02-21 HISTORY — PX: KNEE CLOSED REDUCTION: SHX995

## 2019-02-21 HISTORY — PX: STERIOD INJECTION: SHX5046

## 2019-02-21 SURGERY — MANIPULATION, KNEE, CLOSED
Anesthesia: General | Site: Knee | Laterality: Right

## 2019-02-21 MED ORDER — MIDAZOLAM HCL 2 MG/2ML IJ SOLN
INTRAMUSCULAR | Status: AC
  Filled 2019-02-21: qty 2

## 2019-02-21 MED ORDER — HYDROCODONE-ACETAMINOPHEN 5-325 MG PO TABS: 1.0000 | ORAL_TABLET | Freq: Four times a day (QID) | ORAL | 0 refills | Status: AC | PRN

## 2019-02-21 MED ORDER — PROPOFOL 10 MG/ML IV BOLUS
INTRAVENOUS | Status: DC | PRN
  Administered 2019-02-21: 200 mg via INTRAVENOUS

## 2019-02-21 MED ORDER — MIDAZOLAM HCL 2 MG/2ML IJ SOLN: 1.0000 mg | INTRAMUSCULAR | Status: DC | PRN

## 2019-02-21 MED ORDER — METHYLPREDNISOLONE ACETATE 40 MG/ML IJ SUSP
INTRAMUSCULAR | Status: AC
  Filled 2019-02-21: qty 1

## 2019-02-21 MED ORDER — LIDOCAINE HCL (CARDIAC) PF 100 MG/5ML IV SOSY
PREFILLED_SYRINGE | INTRAVENOUS | Status: DC | PRN
  Administered 2019-02-21: 100 mg via INTRAVENOUS

## 2019-02-21 MED ORDER — FENTANYL CITRATE (PF) 100 MCG/2ML IJ SOLN
50.0000 ug | INTRAMUSCULAR | Status: DC | PRN
  Administered 2019-02-21 (×2): 100 ug via INTRAVENOUS

## 2019-02-21 MED ORDER — CHLORHEXIDINE GLUCONATE 4 % EX LIQD: 60.0000 mL | Freq: Once | CUTANEOUS | Status: DC

## 2019-02-21 MED ORDER — ONDANSETRON HCL 4 MG/2ML IJ SOLN
INTRAMUSCULAR | Status: DC | PRN
  Administered 2019-02-21: 4 mg via INTRAVENOUS

## 2019-02-21 MED ORDER — FENTANYL CITRATE (PF) 100 MCG/2ML IJ SOLN
INTRAMUSCULAR | Status: AC
  Filled 2019-02-21: qty 2

## 2019-02-21 MED ORDER — DEXAMETHASONE SODIUM PHOSPHATE 4 MG/ML IJ SOLN
INTRAMUSCULAR | Status: DC | PRN
  Administered 2019-02-21: 10 mg via INTRAVENOUS

## 2019-02-21 MED ORDER — BUPIVACAINE HCL (PF) 0.5 % IJ SOLN
INTRAMUSCULAR | Status: AC
  Filled 2019-02-21: qty 30

## 2019-02-21 MED ORDER — PROPOFOL 10 MG/ML IV BOLUS
INTRAVENOUS | Status: AC
  Filled 2019-02-21: qty 20

## 2019-02-21 MED ORDER — BUPIVACAINE HCL (PF) 0.5 % IJ SOLN
INTRAMUSCULAR | Status: DC | PRN
  Administered 2019-02-21: 4 mL via INTRA_ARTICULAR

## 2019-02-21 MED ORDER — LACTATED RINGERS IV SOLN
INTRAVENOUS | Status: DC
  Administered 2019-02-21: 09:00:00 via INTRAVENOUS

## 2019-02-21 MED ORDER — METHYLPREDNISOLONE ACETATE 40 MG/ML IJ SUSP
INTRAMUSCULAR | Status: DC | PRN
  Administered 2019-02-21: 40 mg via INTRA_ARTICULAR

## 2019-02-21 MED ORDER — SCOPOLAMINE 1 MG/3DAYS TD PT72: 1.0000 | MEDICATED_PATCH | Freq: Once | TRANSDERMAL | Status: DC

## 2019-02-21 SURGICAL SUPPLY — 14 items
GLOVE BIOGEL PI IND STRL 7.0 (GLOVE) IMPLANT
GLOVE BIOGEL PI INDICATOR 7.0 (GLOVE)
GLOVE SKINSENSE NS SZ7.5 (GLOVE)
GLOVE SKINSENSE STRL SZ7.5 (GLOVE) IMPLANT
GOWN STRL REIN XL XLG (GOWN DISPOSABLE) IMPLANT
GOWN STRL REUS W/ TWL XL LVL3 (GOWN DISPOSABLE) IMPLANT
GOWN STRL REUS W/TWL XL LVL3 (GOWN DISPOSABLE)
NDL SAFETY ECLIPSE 18X1.5 (NEEDLE) ×1 IMPLANT
NEEDLE HYPO 18GX1.5 SHARP (NEEDLE) ×1
NEEDLE HYPO 22GX1.5 SAFETY (NEEDLE) ×2 IMPLANT
PAD ALCOHOL SWAB (MISCELLANEOUS) ×4 IMPLANT
SYR 20ML LL LF (SYRINGE) IMPLANT
SYR CONTROL 10ML LL (SYRINGE) ×2 IMPLANT
TOWEL GREEN STERILE FF (TOWEL DISPOSABLE) IMPLANT

## 2019-02-21 NOTE — Brief Op Note (Signed)
02/21/2019  10:29 AM  PATIENT:  Joseph Craig  73 y.o. male  PRE-OPERATIVE DIAGNOSIS:  Arthrofibrosis right knee  POST-OPERATIVE DIAGNOSIS:  Arthrofibrosis right knee  PROCEDURE:  Procedure(s): CLOSED MANIPULATION RIGHT KNEE (Right) STEROID INJECTION (Right)  SURGEON:  Surgeon(s) and Role:    Mcarthur Rossetti, MD - Primary  ANESTHESIA:   local and general   PLAN OF CARE: Discharge to home after PACU  PATIENT DISPOSITION:  PACU - hemodynamically stable.   Delay start of Pharmacological VTE agent (>24hrs) due to surgical blood loss or risk of bleeding: not applicable

## 2019-02-21 NOTE — Anesthesia Postprocedure Evaluation (Signed)
Anesthesia Post Note  Patient: Joseph Craig  Procedure(s) Performed: CLOSED MANIPULATION RIGHT KNEE (Right Knee) STEROID INJECTION (Right Knee)     Patient location during evaluation: PACU Anesthesia Type: General Level of consciousness: awake and alert Pain management: pain level controlled Vital Signs Assessment: post-procedure vital signs reviewed and stable Respiratory status: spontaneous breathing, nonlabored ventilation, respiratory function stable and patient connected to nasal cannula oxygen Cardiovascular status: blood pressure returned to baseline and stable Postop Assessment: no apparent nausea or vomiting Anesthetic complications: no    Last Vitals:  Vitals:   02/21/19 1100 02/21/19 1118  BP: 130/74 133/79  Pulse: 60   Resp: 15   Temp:  36.6 C  SpO2: 97% 94%    Last Pain:  Vitals:   02/21/19 1118  TempSrc:   PainSc: 2                  Khalifa Knecht S

## 2019-02-21 NOTE — Anesthesia Procedure Notes (Signed)
Procedure Name: LMA Insertion Performed by: Sedale Jenifer, [place], CRNA Pre-anesthesia Checklist: Patient identified, Emergency Drugs available, Suction available and Patient being monitored Patient Re-evaluated:Patient Re-evaluated prior to induction Oxygen Delivery Method: Circle system utilized Preoxygenation: Pre-oxygenation with 100% oxygen Induction Type: IV induction Ventilation: Mask ventilation without difficulty LMA: LMA inserted LMA Size: 4.0 Number of attempts: 1 Airway Equipment and Method: Bite block Placement Confirmation: positive ETCO2 Tube secured with: Tape Dental Injury: Teeth and Oropharynx as per pre-operative assessment        

## 2019-02-21 NOTE — Anesthesia Preprocedure Evaluation (Addendum)
Anesthesia Evaluation  Patient identified by MRN, date of birth, ID band Patient awake    Reviewed: Allergy & Precautions, NPO status , Patient's Chart, lab work & pertinent test results  Airway Mallampati: III  TM Distance: <3 FB Neck ROM: Full    Dental no notable dental hx.    Pulmonary neg pulmonary ROS,    Pulmonary exam normal breath sounds clear to auscultation       Cardiovascular hypertension, Normal cardiovascular exam Rhythm:Regular Rate:Normal     Neuro/Psych negative neurological ROS  negative psych ROS   GI/Hepatic negative GI ROS, Neg liver ROS,   Endo/Other  Morbid obesity  Renal/GU negative Renal ROS  negative genitourinary   Musculoskeletal negative musculoskeletal ROS (+)   Abdominal   Peds negative pediatric ROS (+)  Hematology negative hematology ROS (+)   Anesthesia Other Findings   Reproductive/Obstetrics negative OB ROS                             Anesthesia Physical Anesthesia Plan  ASA: II  Anesthesia Plan: General   Post-op Pain Management:    Induction: Intravenous  PONV Risk Score and Plan: 2 and Ondansetron, Dexamethasone and Treatment may vary due to age or medical condition  Airway Management Planned: LMA  Additional Equipment:   Intra-op Plan:   Post-operative Plan: Extubation in OR  Informed Consent: I have reviewed the patients History and Physical, chart, labs and discussed the procedure including the risks, benefits and alternatives for the proposed anesthesia with the patient or authorized representative who has indicated his/her understanding and acceptance.     Dental advisory given  Plan Discussed with: CRNA and Surgeon  Anesthesia Plan Comments:         Anesthesia Quick Evaluation

## 2019-02-21 NOTE — H&P (Signed)
Joseph Craig is an 73 y.o. male.   Chief Complaint:  Unable to bend right total knee replacement HPI:   73 yo male who has developed post-operative arthrofibrosis of his right knee status-post a right total knee replacement.  He wishes to proceed at this point with a right knee manipulation under anesthesia.  Past Medical History:  Diagnosis Date  . Arthritis   . Arthrofibrosis of knee joint, right   . Cancer ([place])    Skin-Precancerous tag removed  . Hypertension   . Sinus congestion     Past Surgical History:  Procedure Laterality Date  . CYSTOSCOPY N/A 11/16/2018   Procedure: CYSTOSCOPY FLEXIBLE, ureteral dilation with foley placement;  Surgeon: Mcarthur Rossetti, MD;  Location: [facility];  Service: Orthopedics;  Laterality: N/A;  . KNEE ARTHROSCOPY Right   . TOTAL KNEE ARTHROPLASTY Right 11/16/2018   Procedure: RIGHT TOTAL KNEE ARTHROPLASTY;  Surgeon: Mcarthur Rossetti, MD;  Location: [facility];  Service: Orthopedics;  Laterality: Right;  with block    History reviewed. No pertinent family history. Social History:  reports that he has never smoked. He has never used smokeless tobacco. He reports current alcohol use of about 1.0 standard drinks of alcohol per week. He reports that he does not use drugs.  Allergies:  Allergies  Allergen Reactions  . Penicillins Anaphylaxis    Did it involve swelling of the face/tongue/throat, SOB, or low BP? Yes Did it involve sudden or severe rash/hives, skin peeling, or any reaction on the inside of your mouth or nose? No Did you need to seek medical attention at a hospital or doctor's office? Yes When did it last happen?childhood If all above answers are "NO", may proceed with cephalosporin use.     Medications Prior to Admission  Medication Sig Dispense Refill  . amLODipine (NORVASC) 5 MG tablet Take 5 mg by mouth daily.     Marland Kitchen aspirin 81 MG chewable tablet Chew by mouth daily.    Marland Kitchen lisinopril (PRINIVIL,ZESTRIL) 40 MG tablet  Take 40 mg by mouth 2 (two) times a day.     . meloxicam (MOBIC) 7.5 MG tablet TAKE 1 TABLET (7.5 MG TOTAL) BY MOUTH 2 (TWO) TIMES DAILY BETWEEN MEALS AS NEEDED FOR PAIN. 60 tablet 5  . tamsulosin (FLOMAX) 0.4 MG CAPS capsule Take 0.4 mg by mouth at bedtime.    . triamterene-hydrochlorothiazide (MAXZIDE-25) 37.5-25 MG tablet Take 1 tablet by mouth every morning.      Results for orders placed or performed during the hospital encounter of 02/21/19 (from the past 48 hour(s))  Basic metabolic panel     Status: Abnormal   Collection Time: 02/19/19 10:00 AM  Result Value Ref Range   Sodium 140 135 - 145 mmol/L   Potassium 4.2 3.5 - 5.1 mmol/L   Chloride 102 98 - 111 mmol/L   CO2 29 22 - 32 mmol/L   Glucose, Bld 121 (H) 70 - 99 mg/dL   BUN 11 8 - 23 mg/dL   Creatinine, Ser 1.13 0.61 - 1.24 mg/dL   Calcium 9.1 8.9 - 10.3 mg/dL   GFR calc non Af Amer >60 >60 mL/min   GFR calc Af Amer >60 >60 mL/min   Anion gap 9 5 - 15    Comment: Performed at [facility], [place] [address] [place], [place] [address]   No results found.  ROS  Blood pressure (!) 155/76, pulse 65, temperature 98.6 F (37 C), temperature source Oral, height 5' 8.5" (1.74 m),  weight 123 kg, SpO2 97 %. Physical Exam  Constitutional: He is oriented to person, place, and time. He appears well-developed and well-nourished.  HENT:  Head: Normocephalic and atraumatic.  Eyes: Pupils are equal, round, and reactive to light.  Neck: Normal range of motion.  Cardiovascular: Normal rate.  Respiratory: Effort normal.  GI: Soft.  Musculoskeletal:     Right knee: He exhibits decreased range of motion.  Neurological: He is alert and oriented to person, place, and time.  Skin: Skin is warm and dry.  Psychiatric: He has a normal mood and affect.     Assessment/Plan Arthrofibrosis of right knee post right total knee  To the OR today as an outpatient for a right knee joint manipulation under anesthesia.  The risks and benefits  of surgery have been described in detail and informed consent is obtained.  Mcarthur Rossetti, MD 02/21/2019, 9:54 AM

## 2019-02-21 NOTE — Discharge Instructions (Signed)
Work on bending your knee as much as possible.   Post Anesthesia Home Care Instructions  Activity: Get plenty of rest for the remainder of the day. A responsible individual must stay with you for 24 hours following the procedure.  For the next 24 hours, DO NOT: -Drive a car -Paediatric nurse -Drink alcoholic beverages -Take any medication unless instructed by your physician -Make any legal decisions or sign important papers.  Meals: Start with liquid foods such as gelatin or soup. Progress to regular foods as tolerated. Avoid greasy, spicy, heavy foods. If nausea and/or vomiting occur, drink only clear liquids until the nausea and/or vomiting subsides. Call your physician if vomiting continues.  Special Instructions/Symptoms: Your throat may feel dry or sore from the anesthesia or the breathing tube placed in your throat during surgery. If this causes discomfort, gargle with warm salt water. The discomfort should disappear within 24 hours.  If you had a scopolamine patch placed behind your ear for the management of post- operative nausea and/or vomiting:  1. The medication in the patch is effective for 72 hours, after which it should be removed.  Wrap patch in a tissue and discard in the trash. Wash hands thoroughly with soap and water. 2. You may remove the patch earlier than 72 hours if you experience unpleasant side effects which may include dry mouth, dizziness or visual disturbances. 3. Avoid touching the patch. Wash your hands with soap and water after contact with the patch.    Call your surgeon if you experience:   1.  Fever over 101.0. 2.  Inability to urinate. 3.  Nausea and/or vomiting. 4.  Extreme swelling or bruising at the surgical site. 5.  Continued bleeding from the incision. 6.  Increased pain, redness or drainage from the incision. 7.  Problems related to your pain medication. 8.  Any problems and/or concerns

## 2019-02-21 NOTE — Transfer of Care (Signed)
Immediate Anesthesia Transfer of Care Note  Patient: Joseph Craig  Procedure(s) Performed: CLOSED MANIPULATION RIGHT KNEE (Right Knee) STEROID INJECTION (Right Knee)  Patient Location: PACU  Anesthesia Type:General  Level of Consciousness: awake and alert   Airway & Oxygen Therapy: Patient Spontanous Breathing and Patient connected to face mask oxygen  Post-op Assessment: Report given to RN and Post -op Vital signs reviewed and stable  Post vital signs: Reviewed and stable  Last Vitals:  Vitals Value Taken Time  BP 123/73 02/21/19 1032  Temp    Pulse 62 02/21/19 1035  Resp 17 02/21/19 1035  SpO2 99 % 02/21/19 1035  Vitals shown include unvalidated device data.  Last Pain:  Vitals:   02/21/19 0843  TempSrc: Oral  PainSc: 2       Patients Stated Pain Goal: 5 ([id number])  Complications: No apparent anesthesia complications

## 2019-02-21 NOTE — Op Note (Signed)
NAME: MCNEIL, Sopheap MEDICAL RECORD [id number] ACCOUNT [id number] DATE OF BIRTH:05-05-1946 FACILITY: [facility] LOCATION: [facility] PHYSICIAN:Elaiza Shoberg Kerry [place], MD  OPERATIVE REPORT  DATE OF PROCEDURE:  02/21/2019  PREOPERATIVE DIAGNOSIS:  Arthrofibrosis of right knee joint, status post right total knee arthroplasty.  POSTOPERATIVE DIAGNOSIS:  Arthrofibrosis of right knee joint, status post right total knee arthroplasty.  PROCEDURE:  Right knee manipulation under anesthesia.  SURGEON:  Lind Guest. Ninfa Linden, MD  ANESTHESIA: 1.  General via LMA. 2.  Local with a mixture of 0.5% plain Marcaine and 1 mL of 40 mg of Depo-Medrol.  COMPLICATIONS:  None.  INDICATIONS:  The patient is a 73 year old gentleman who has a BMI of 40.6.  He had a right total knee arthroplasty done a few months ago.  His postoperative course has been complicated by just postoperative arthrofibrosis.  He does well from a pain  standpoint, but he has just not been able to get his knee to flex much beyond 80 degrees or so.  At this point, we have recommended manipulation under anesthesia.  I had a long and thorough discussion about this.  He is set up for physical therapy  postoperatively and for next week as well.  We talked about the risk of mainly fracture and still decreased motion as well as pain.  DESCRIPTION OF PROCEDURE:  After informed consent was obtained and appropriate right knee was marked, he was brought to the operating room and placed supine on the operating table.  General anesthesia was then obtained.  A time-out was called.  He was  identified as correct patient, correct right knee.  His extension then was assessed and found to be almost full.  I was able to then gently manipulate his knee, and I flexed him back to about 105-110 degrees.  Some of the other flexion is mainly just  limited by his obesity.  I did feel good about getting him flexed.  We put him through several cycles of motion.  We  then prepped the superolateral aspect of the left knee with alcohol swabs and then inserted my mixture of Marcaine and Depo-Medrol.  A  Band-Aid was placed over this.  He was awakened, extubated, and taken to recovery room in stable condition.  All final counts were correct.  There were no complications noted.  LN/NUANCE  D:02/21/2019 T:02/21/2019 JOB:[id number]

## 2019-02-22 ENCOUNTER — Encounter (HOSPITAL_BASED_OUTPATIENT_CLINIC_OR_DEPARTMENT_OTHER): Payer: Self-pay | Admitting: Orthopaedic Surgery

## 2019-03-06 ENCOUNTER — Encounter: Payer: Self-pay | Admitting: Orthopaedic Surgery

## 2019-03-06 ENCOUNTER — Telehealth: Payer: Self-pay

## 2019-03-06 ENCOUNTER — Ambulatory Visit (INDEPENDENT_AMBULATORY_CARE_PROVIDER_SITE_OTHER): Payer: BC Managed Care – PPO | Admitting: Orthopaedic Surgery

## 2019-03-06 DIAGNOSIS — M24661 Ankylosis, right knee: Secondary | ICD-10-CM

## 2019-03-06 DIAGNOSIS — Z96651 Presence of right artificial knee joint: Secondary | ICD-10-CM

## 2019-03-06 NOTE — Telephone Encounter (Signed)
Therapy office calling for notes from today's visit to be faxed to them Fax # [phone]

## 2019-03-06 NOTE — Progress Notes (Signed)
HPI: [name] returns today follow-up of his right knee.  He status post right total knee arthroplasty now 110 days.  Status post closed manipulation right knee 13 days.  He is working with physical therapy feels he is making improvement in his range of motion.  He brings with him a note stating that the leg flexing to 101 degrees.  Physical exam: Right knee full extension flexion to approximately 97 degrees.  No instability valgus varus stressing calf supple nontender.  Surgical incisions well-healed.  Impression: Status post right total knee arthroplasty Status post right knee manipulation  Plan: Recommend he continue to work with physical therapy to gain maximal range of motion strengthening prior to going back.  Work November 2 as planned.  We will see him back in a month to see what type of progress he is made.  Questions were encouraged and answered at length.

## 2019-03-07 ENCOUNTER — Telehealth: Payer: Self-pay

## 2019-03-07 NOTE — Telephone Encounter (Signed)
Emailed the 03/06/19 office note and work note to wc adj [employer] @ [place] ([email or web address])

## 2019-03-07 NOTE — Telephone Encounter (Signed)
Faxed to provided number  

## 2019-04-03 ENCOUNTER — Encounter: Payer: Self-pay | Admitting: Orthopaedic Surgery

## 2019-04-03 ENCOUNTER — Ambulatory Visit (INDEPENDENT_AMBULATORY_CARE_PROVIDER_SITE_OTHER): Payer: No Typology Code available for payment source | Admitting: Orthopaedic Surgery

## 2019-04-03 ENCOUNTER — Other Ambulatory Visit: Payer: Self-pay

## 2019-04-03 DIAGNOSIS — Z96651 Presence of right artificial knee joint: Secondary | ICD-10-CM

## 2019-04-03 DIAGNOSIS — M24661 Ankylosis, right knee: Secondary | ICD-10-CM

## 2019-04-03 NOTE — Progress Notes (Signed)
Office Visit Note   Patient: Joseph Craig           Date of Birth: [birth year]           MRN: [id number] Visit Date: 04/03/2019              Requested by: No referring provider defined for this encounter. PCP: Patient, No Pcp Per   Assessment & Plan: Visit Diagnoses:  1. Status post total right knee replacement   2. Arthrofibrosis of knee joint, right     Plan: We will see him back in a month to check his progress.  We will continue work conditioning and hardening with physical therapy.  He has good rehab potential and has made great strides however I do feel that his swelling is still impeding his progress.  Reevaluate his work status follow-up in 1 month.  Follow-Up Instructions: Return in about 4 weeks (around 05/01/2019).   Orders:  No orders of the defined types were placed in this encounter.  No orders of the defined types were placed in this encounter.     Procedures: No procedures performed   Clinical Data: No additional findings.   Subjective: Chief Complaint  Patient presents with  . Left Knee - Follow-up    HPI Mr. Brien Mates returns today 41 days status post manipulation of the right total knee arthroplasty.  He has been working with physical therapy for work hardening conditioning.  He feels he is making progress but still unable to bend the knee back to the swelling.  He is wearing compression stockings.  He has significant pain and swelling after therapy. Review of Systems Negative for fevers chills shortness breath chest pain.  Objective: Vital Signs: There were no vitals taken for this visit.  Physical Exam Constitutional:      Appearance: He is not ill-appearing or diaphoretic.  Pulmonary:     Effort: Pulmonary effort is normal.  Neurological:     Mental Status: He is alert and oriented to person, place, and time.     Ortho Exam Right knee surgical incisions well-healed no signs of infection.  Has full extension knee flexion to  approximately 95 degrees limited by the diameter of his calf and thigh.  Right calf supple nontender. Specialty Comments:  No specialty comments available.  Imaging: No results found.   PMFS History: Patient Active Problem List   Diagnosis Date Noted  . Arthrofibrosis of right total knee arthroplasty ([place]) 02/21/2019  . Status post total right knee replacement 11/16/2018  . Unilateral primary osteoarthritis, right knee 06/29/2017  . Status post arthroscopy of right knee 02/02/2017  . 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 Primary osteoarthritis of right knee 10/14/2016  . Unilateral primary osteoarthritis, left knee 08/03/2016  . Chronic pain of right knee 08/03/2016   Past Medical History:  Diagnosis Date  . Arthritis   . Arthrofibrosis of knee joint, right   . Cancer ([place])    Skin-Precancerous tag removed  . Hypertension   . Sinus congestion     History reviewed. No pertinent family history.  Past Surgical History:  Procedure Laterality Date  . CYSTOSCOPY N/A 11/16/2018   Procedure: CYSTOSCOPY FLEXIBLE, ureteral dilation with foley placement;  Surgeon: Mcarthur Rossetti, MD;  Location: [facility];  Service: Orthopedics;  Laterality: N/A;  . KNEE ARTHROSCOPY Right   . KNEE CLOSED REDUCTION Right 02/21/2019   Procedure: CLOSED MANIPULATION RIGHT KNEE;  Surgeon: Mcarthur Rossetti, MD;  Location: [facility]  [place];  Service: Orthopedics;  Laterality: Right;  . STERIOD INJECTION Right 02/21/2019   Procedure: STEROID INJECTION;  Surgeon: Mcarthur Rossetti, MD;  Location: [place];  Service: Orthopedics;  Laterality: Right;  . TOTAL KNEE ARTHROPLASTY Right 11/16/2018   Procedure: RIGHT TOTAL KNEE ARTHROPLASTY;  Surgeon: Mcarthur Rossetti, MD;  Location: [facility];  Service: Orthopedics;  Laterality: Right;  with block   Social History   Occupational History  . Not on file  Tobacco Use  . Smoking status: Never  Smoker  . Smokeless tobacco: Never Used  Substance and Sexual Activity  . Alcohol use: Yes    Alcohol/week: 1.0 standard drinks    Types: 1 Cans of beer per week    Comment: daily beer  . Drug use: Never  . Sexual activity: Not on file

## 2019-04-04 ENCOUNTER — Telehealth: Payer: Self-pay | Admitting: Orthopaedic Surgery

## 2019-04-04 NOTE — Telephone Encounter (Signed)
Ok, just give antibiotic?

## 2019-04-04 NOTE — Telephone Encounter (Signed)
He does not need an antibiotic at this point for routine cleanings.

## 2019-04-04 NOTE — Telephone Encounter (Signed)
Patient called. Would like to know if he can get his teeth clean or not. His call back number is [phone]

## 2019-04-04 NOTE — Telephone Encounter (Signed)
Patient aware of the below  

## 2019-04-10 ENCOUNTER — Telehealth: Payer: Self-pay

## 2019-04-10 NOTE — Telephone Encounter (Signed)
Faxed the 04/03/19 office and work note to adj per her request

## 2019-05-01 ENCOUNTER — Encounter: Payer: Self-pay | Admitting: Orthopaedic Surgery

## 2019-05-01 ENCOUNTER — Other Ambulatory Visit: Payer: Self-pay

## 2019-05-01 ENCOUNTER — Ambulatory Visit (INDEPENDENT_AMBULATORY_CARE_PROVIDER_SITE_OTHER): Payer: No Typology Code available for payment source | Admitting: Orthopaedic Surgery

## 2019-05-01 DIAGNOSIS — Z96651 Presence of right artificial knee joint: Secondary | ICD-10-CM

## 2019-05-01 DIAGNOSIS — M24661 Ankylosis, right knee: Secondary | ICD-10-CM

## 2019-05-01 NOTE — Progress Notes (Signed)
HPI: [name] returns status post right knee manipulation 04/03/2019.  He continues to work with therapy.  He is making progress with therapy and also with work conditioning.  Therapy is recommending continued work conditioning for 2 times a week for the next 2 weeks.  Patient still has swelling whenever up with the leg but is seeing some progress in regards to the swelling.  Physical exam: Right knee full extension flexion to approximately 103 degrees.  Surgical incisions well-healed.  Calf supple nontender.  Impression: Status post right total knee replacement Arthrofibrosis right knee status post manipulation 04/03/2019  Plan: Recommend he continue to work with physical therapy for range of motion strengthening decrease swelling.  Also work on work conditioning for the next 2 week twice weekly.  We will keep him out of work for now.  Reevaluate his work status in mid December around December 16 when he comes back for follow-up.

## 2019-05-08 ENCOUNTER — Telehealth: Payer: Self-pay

## 2019-05-08 NOTE — Telephone Encounter (Signed)
Physical Therapy called and want to get a Rx for patient for his physical therapy on script. Please fax to [phone], thanks.

## 2019-05-08 NOTE — Telephone Encounter (Signed)
Faxed to provided number  

## 2019-05-29 ENCOUNTER — Ambulatory Visit: Payer: BC Managed Care – PPO | Admitting: Orthopaedic Surgery

## 2019-06-04 ENCOUNTER — Encounter: Payer: Self-pay | Admitting: Orthopaedic Surgery

## 2019-06-04 ENCOUNTER — Other Ambulatory Visit: Payer: Self-pay

## 2019-06-04 ENCOUNTER — Ambulatory Visit (INDEPENDENT_AMBULATORY_CARE_PROVIDER_SITE_OTHER): Payer: No Typology Code available for payment source | Admitting: Orthopaedic Surgery

## 2019-06-04 DIAGNOSIS — Z96651 Presence of right artificial knee joint: Secondary | ICD-10-CM

## 2019-06-04 NOTE — Progress Notes (Signed)
The patient is now about 6 months status post a right total knee arthroplasty.  He has been going through extensive therapy with both physical therapy and OT.  We had to manipulate his knee several months after his initial replacement.  He says some days he is able to flex to 110 degrees and is happy overall.  He says is not really have any pain.  He is ready to go back to work he states.  I have agreed with this but would like him to wait until after the first of the year.  He is a very active 73 years old.  On exam his right knee is fully extended.  I can flex him to about 100 degrees.  It feels ligamentously stable.  There is no evidence of infection.  His calf is soft.  The knee is ligamentously stable.  I will allow him to return to work starting June 17, 2019.  His only restrictions will be no climbing ladders and no crawling on his knee.  I would like him to still participate in outpatient physical therapy for about 6 weeks to work on maximizing the function of his knee as he returns to work.  From my standpoint I do not need to see him back for 6 months.  If there is any issues before then he will let us know.  At his next visit in 6 months I would like an AP and lateral of his right knee.

## 2019-06-11 ENCOUNTER — Other Ambulatory Visit: Payer: Self-pay

## 2019-06-11 DIAGNOSIS — Z96651 Presence of right artificial knee joint: Secondary | ICD-10-CM

## 2019-06-18 ENCOUNTER — Telehealth: Payer: Self-pay | Admitting: Orthopaedic Surgery

## 2019-06-18 NOTE — Telephone Encounter (Signed)
Received call from [facility] with [facility] (PT) needing clarification on the order. The number to contact Helene Kelp is [phone] opt 1

## 2019-06-18 NOTE — Telephone Encounter (Signed)
Returned call, faxed over order to clarify work conditioning.

## 2019-08-07 ENCOUNTER — Ambulatory Visit (INDEPENDENT_AMBULATORY_CARE_PROVIDER_SITE_OTHER): Payer: No Typology Code available for payment source

## 2019-08-07 ENCOUNTER — Other Ambulatory Visit: Payer: Self-pay

## 2019-08-07 ENCOUNTER — Encounter: Payer: Self-pay | Admitting: Orthopaedic Surgery

## 2019-08-07 ENCOUNTER — Ambulatory Visit (INDEPENDENT_AMBULATORY_CARE_PROVIDER_SITE_OTHER): Payer: No Typology Code available for payment source | Admitting: Orthopaedic Surgery

## 2019-08-07 DIAGNOSIS — Z96651 Presence of right artificial knee joint: Secondary | ICD-10-CM

## 2019-08-07 NOTE — Progress Notes (Signed)
Office Visit Note   Patient: Joseph Craig           Date of Birth: 1946-03-27           MRN: [id number] Visit Date: 08/07/2019              Requested by: No referring provider defined for this encounter. PCP: Patient, No Pcp Per   Assessment & Plan: Visit Diagnoses:  1. Status post total right knee replacement     Plan: We will see him in follow-up in 3 months from now.  At that visit no x-rays are needed.  We will likely work on his disability rating at that standpoint since it will be almost 1 year out from surgery.  He is already back to most of his regular work duties.  Follow-Up Instructions: Return in about 3 months (around 11/04/2019).   Orders:  Orders Placed This Encounter  Procedures  . XR Knee 1-2 Views Right   No orders of the defined types were placed in this encounter.     Procedures: No procedures performed   Clinical Data: No additional findings.   Subjective: Chief Complaint  Patient presents with  . Right Knee - Follow-up  The patient is now about 8 months status post a right total knee arthroplasty.  This was secondary to severe osteoarthritis of his right knee.  This all started with a significant meniscal tear that occurred in a work-related accident sometime ago.  He had tried and failed all forms of conservative treatment up until finally having his knee replaced.  He reports that therapy is going well he still has intermittent numbness and tingling around his knee as well as knee swelling.  He is someone who is a large person weighing about 171 pounds with a BMI of over 40.  He has had no other acute changes in medical status since surgery.  Recently things been doing well for him otherwise. Postoperatively he did have stiffness in the knee that did require rest taking him to the operating room for a manipulation under anesthesia.  He has still been participating in therapy on this knee. HPI  Review of Systems He currently denies any headache,  chest pain, shortness of breath, fever, chills, nausea, vomiting  Objective: Vital Signs: There were no vitals taken for this visit.  Physical Exam He is alert and orient x3 and in no acute distress Ortho Exam Examination of his right operative knee shows a well-healed surgical incision.  His extension is full and his flexion is to about 105 or so degrees.  The knee feels stable on exam. Specialty Comments:  No specialty comments available.  Imaging: XR Knee 1-2 Views Right  Result Date: 08/07/2019 2 views of the right knee show a well-seated total knee arthroplasty with no complicating features.    PMFS History: Patient Active Problem List   Diagnosis Date Noted  . Arthrofibrosis of right total knee arthroplasty ([place]) 02/21/2019  . Status post total right knee replacement 11/16/2018  . Unilateral primary osteoarthritis, right knee 06/29/2017  . Status post arthroscopy of right knee 02/02/2017  . 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 Primary osteoarthritis of right knee 10/14/2016  . Unilateral primary osteoarthritis, left knee 08/03/2016  . Chronic pain of right knee 08/03/2016   Past Medical History:  Diagnosis Date  . Arthritis   . Arthrofibrosis of knee joint, right   . Cancer ([place])    Skin-Precancerous tag removed  .  Hypertension   . Sinus congestion     History reviewed. No pertinent family history.  Past Surgical History:  Procedure Laterality Date  . CYSTOSCOPY N/A 11/16/2018   Procedure: CYSTOSCOPY FLEXIBLE, ureteral dilation with foley placement;  Surgeon: Mcarthur Rossetti, MD;  Location: [facility];  Service: Orthopedics;  Laterality: N/A;  . KNEE ARTHROSCOPY Right   . KNEE CLOSED REDUCTION Right 02/21/2019   Procedure: CLOSED MANIPULATION RIGHT KNEE;  Surgeon: Mcarthur Rossetti, MD;  Location: [place];  Service: Orthopedics;  Laterality: Right;  . STERIOD INJECTION Right 02/21/2019   Procedure: STEROID  INJECTION;  Surgeon: Mcarthur Rossetti, MD;  Location: [place];  Service: Orthopedics;  Laterality: Right;  . TOTAL KNEE ARTHROPLASTY Right 11/16/2018   Procedure: RIGHT TOTAL KNEE ARTHROPLASTY;  Surgeon: Mcarthur Rossetti, MD;  Location: [facility];  Service: Orthopedics;  Laterality: Right;  with block   Social History   Occupational History  . Not on file  Tobacco Use  . Smoking status: Never Smoker  . Smokeless tobacco: Never Used  Substance and Sexual Activity  . Alcohol use: Yes    Alcohol/week: 1.0 standard drinks    Types: 1 Cans of beer per week    Comment: daily beer  . Drug use: Never  . Sexual activity: Not on file

## 2019-11-04 ENCOUNTER — Ambulatory Visit (INDEPENDENT_AMBULATORY_CARE_PROVIDER_SITE_OTHER): Payer: No Typology Code available for payment source | Admitting: Orthopaedic Surgery

## 2019-11-04 ENCOUNTER — Other Ambulatory Visit: Payer: Self-pay

## 2019-11-04 ENCOUNTER — Encounter: Payer: Self-pay | Admitting: Orthopaedic Surgery

## 2019-11-04 DIAGNOSIS — Z96651 Presence of right artificial knee joint: Secondary | ICD-10-CM

## 2019-11-04 NOTE — Progress Notes (Signed)
The patient comes in today close to 1 year status post a right total knee arthroplasty.  He still has significant peripheral edema in both his legs but much worse on his right operative side.  He is making progress with his knee.  On examination his right knee his incisions healed nicely.  There is a mild effusion noted but no redness.  His extension is full and his flexion is past 110 degrees and is much improved overall.  The knee feels ligamentously stable.  Due to continued swelling and his peripheral edema I would like to see him back for another visit in about 3 months from now.  At that visit I like a standing AP and lateral of his right operative knee.

## 2019-11-07 ENCOUNTER — Telehealth: Payer: Self-pay

## 2019-11-07 NOTE — Telephone Encounter (Signed)
Faxed 11/04/19 office note to adj per her request along with next appt info

## 2019-12-03 ENCOUNTER — Ambulatory Visit: Payer: BC Managed Care – PPO | Admitting: Orthopaedic Surgery

## 2019-12-11 ENCOUNTER — Telehealth: Payer: Self-pay | Admitting: Orthopaedic Surgery

## 2019-12-11 NOTE — Telephone Encounter (Signed)
ER

## 2019-12-17 ENCOUNTER — Telehealth: Payer: Self-pay

## 2019-12-17 ENCOUNTER — Encounter: Payer: Self-pay | Admitting: Radiology

## 2019-12-17 NOTE — Telephone Encounter (Signed)
Patient returning call regarding questions

## 2019-12-17 NOTE — Telephone Encounter (Signed)
You mind calling him?

## 2019-12-17 NOTE — Telephone Encounter (Signed)
Spoke with patient.  Sent new note to [facility] adjuster via fax/email. 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

## 2020-02-05 ENCOUNTER — Ambulatory Visit (INDEPENDENT_AMBULATORY_CARE_PROVIDER_SITE_OTHER): Payer: No Typology Code available for payment source | Admitting: Orthopaedic Surgery

## 2020-02-05 ENCOUNTER — Encounter: Payer: Self-pay | Admitting: Orthopaedic Surgery

## 2020-02-05 ENCOUNTER — Ambulatory Visit (INDEPENDENT_AMBULATORY_CARE_PROVIDER_SITE_OTHER): Payer: No Typology Code available for payment source

## 2020-02-05 DIAGNOSIS — Z96651 Presence of right artificial knee joint: Secondary | ICD-10-CM

## 2020-02-05 NOTE — Progress Notes (Signed)
The patient comes in today for continued follow-up for a total knee arthroplasty that was performed in June 2020.  His original reason for seeing me was from a injury to that right knee that caused a medial meniscal tear.  This was work-related.  Over time he ended up needing a knee replacement due to the worsening of arthritis of his knee after an otoscopic intervention and to the degree of arthritis that he had pre-existing that worsened with the meniscal tear.  He is doing well overall.  He has been working but we have had him on some restricted duties.  On examination of his right knee it is ligamentously stable with good range of motion.  He does have venous stasis changes and edema in both legs at the lower aspect today to chronic medical conditions.  We still recommend compressive garments.  X-rays of the right knee show well-seated total knee arthroplasty with no complicating features.  My standpoint I do not really see him back for a year unless there is any issues.  We can have new x-rays at that standpoint.  A full disability letter with rating and restrictions will be forthcoming.

## 2020-03-30 ENCOUNTER — Telehealth: Payer: Self-pay

## 2020-03-30 NOTE — Telephone Encounter (Signed)
Patient called in wanting to speak about recent pain he is having . Still on workers comp.

## 2020-03-30 NOTE — Telephone Encounter (Signed)
I called patient. He continues to have swelling and scar tissue issues.  He went to the [employer] last week to try and fish and knee has been really bothering him ever since. He went back to work today and is having a lot of swelling. He is unsure if he should be seen. He took tylenol while at the beach with some relief.

## 2020-03-30 NOTE — Telephone Encounter (Signed)
Appt made for Wednesday afternoon at 1:45pm.

## 2020-04-01 ENCOUNTER — Encounter: Payer: Self-pay | Admitting: Orthopaedic Surgery

## 2020-04-01 ENCOUNTER — Ambulatory Visit (INDEPENDENT_AMBULATORY_CARE_PROVIDER_SITE_OTHER): Payer: No Typology Code available for payment source | Admitting: Orthopaedic Surgery

## 2020-04-01 VITALS — Ht 68.5 in | Wt 247.0 lb

## 2020-04-01 DIAGNOSIS — M25561 Pain in right knee: Secondary | ICD-10-CM

## 2020-04-01 DIAGNOSIS — Z96651 Presence of right artificial knee joint: Secondary | ICD-10-CM | POA: Diagnosis not present

## 2020-04-01 DIAGNOSIS — G8929 Other chronic pain: Secondary | ICD-10-CM

## 2020-04-01 MED ORDER — LIDOCAINE HCL 1 % IJ SOLN
3.0000 mL | INTRAMUSCULAR | Status: AC | PRN
  Administered 2020-04-01: 3 mL

## 2020-04-01 MED ORDER — METHYLPREDNISOLONE ACETATE 40 MG/ML IJ SUSP
40.0000 mg | INTRAMUSCULAR | Status: AC | PRN
  Administered 2020-04-01: 40 mg via INTRA_ARTICULAR

## 2020-04-01 NOTE — Progress Notes (Signed)
Office Visit Note   Patient: Joseph Craig           Date of Birth: [birth year]           MRN: [id number] Visit Date: 04/01/2020              Requested by: No referring provider defined for this encounter. PCP: Patient, No Pcp Per   Assessment & Plan: Visit Diagnoses:  1. Status post right knee replacement   2. Chronic pain of right knee     Plan: I was only able to aspirate about 15 cc of fluid from his knee that does not appear worrisome.  There is no evidence of infection.  I did place a steroid injection in his knee which I would do a one-time only basis to see if this will calm down some acute inflammation in the knee joint itself.  We will see him back in 4 weeks to see how he is doing overall.  Follow-Up Instructions: Return in about 4 weeks (around 04/29/2020).   Orders:  No orders of the defined types were placed in this encounter.  No orders of the defined types were placed in this encounter.     Procedures: Large Joint Inj: R knee on 04/01/2020 1:53 PM Indications: diagnostic evaluation and pain Details: 22 G 1.5 in needle, superolateral approach  Arthrogram: No  Medications: 3 mL lidocaine 1 %; 40 mg methylPREDNISolone acetate 40 MG/ML Outcome: tolerated well, no immediate complications Procedure, treatment alternatives, risks and benefits explained, specific risks discussed. Consent was given by the patient. Immediately prior to procedure a time out was called to verify the correct patient, procedure, equipment, support staff and site/side marked as required. Patient was prepped and draped in the usual sterile fashion.       Clinical Data: No additional findings.   Subjective: Chief Complaint  Patient presents with  . Right Knee - Pain    02/21/2019 closed manipulation right knee, steroid injection right knee  11/16/2018 right TKA    Joseph Craig comes in today with continued right knee pain status post a right knee arthroplasty that we did in June of  last year.  His original injury was work-related from a meniscal tear.  He then went on to develop progressive arthritis in his right knee and this necessitated a knee replacement.  This past weekend he was up on his knee a lot and started developing some pain and swelling so he comes in today for evaluation and treatment of this.  We did x-ray the knee just at the end of August of this year that showed a well-seated knee replacement with no complicating features.  He denies any symptoms of instability.  He does report some global pain and some swelling.  He has had no other acute changes in medical status.  HPI  Review of Systems He currently denies any headache, chest pain, shortness of breath, fever, chills, nausea, vomiting  Objective: Vital Signs: Ht 5' 8.5" (1.74 m)   Wt 247 lb (112 kg)   BMI 37.01 kg/m   Physical Exam He is alert and orient x3 and in no acute distress Ortho Exam Examination of his right operative knee just shows a mild effusion but no redness.  He has good range of motion of the knee and is ligamentously stable.  He has brawny edema in both lower extremities. Specialty Comments:  No specialty comments available.  Imaging: No results found.   PMFS History: Patient Active Problem List  Diagnosis Date Noted  . Arthrofibrosis of right total knee arthroplasty ([place]) 02/21/2019  . Status post total right knee replacement 11/16/2018  . Unilateral primary osteoarthritis, right knee 06/29/2017  . Status post arthroscopy of right knee 02/02/2017  . 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 Primary osteoarthritis of right knee 10/14/2016  . Unilateral primary osteoarthritis, left knee 08/03/2016  . Chronic pain of right knee 08/03/2016   Past Medical History:  Diagnosis Date  . Arthritis   . Arthrofibrosis of knee joint, right   . Cancer ([place])    Skin-Precancerous tag removed  . Hypertension   . Sinus congestion     No family history on  file.  Past Surgical History:  Procedure Laterality Date  . CYSTOSCOPY N/A 11/16/2018   Procedure: CYSTOSCOPY FLEXIBLE, ureteral dilation with foley placement;  Surgeon: Mcarthur Rossetti, MD;  Location: [facility];  Service: Orthopedics;  Laterality: N/A;  . KNEE ARTHROSCOPY Right   . KNEE CLOSED REDUCTION Right 02/21/2019   Procedure: CLOSED MANIPULATION RIGHT KNEE;  Surgeon: Mcarthur Rossetti, MD;  Location: [facility];  Service: Orthopedics;  Laterality: Right;  . STERIOD INJECTION Right 02/21/2019   Procedure: STEROID INJECTION;  Surgeon: Mcarthur Rossetti, MD;  Location: [facility];  Service: Orthopedics;  Laterality: Right;  . TOTAL KNEE ARTHROPLASTY Right 11/16/2018   Procedure: RIGHT TOTAL KNEE ARTHROPLASTY;  Surgeon: Mcarthur Rossetti, MD;  Location: [facility];  Service: Orthopedics;  Laterality: Right;  with block   Social History   Occupational History  . Not on file  Tobacco Use  . Smoking status: Never Smoker  . Smokeless tobacco: Never Used  Vaping Use  . Vaping Use: Never used  Substance and Sexual Activity  . Alcohol use: Yes    Alcohol/week: 1.0 standard drink    Types: 1 Cans of beer per week    Comment: daily beer  . Drug use: Never  . Sexual activity: Not on file

## 2020-04-29 ENCOUNTER — Ambulatory Visit (INDEPENDENT_AMBULATORY_CARE_PROVIDER_SITE_OTHER): Payer: No Typology Code available for payment source | Admitting: Orthopaedic Surgery

## 2020-04-29 ENCOUNTER — Encounter: Payer: Self-pay | Admitting: Orthopaedic Surgery

## 2020-04-29 DIAGNOSIS — G8929 Other chronic pain: Secondary | ICD-10-CM

## 2020-04-29 DIAGNOSIS — Z96651 Presence of right artificial knee joint: Secondary | ICD-10-CM | POA: Diagnosis not present

## 2020-04-29 DIAGNOSIS — M25561 Pain in right knee: Secondary | ICD-10-CM | POA: Diagnosis not present

## 2020-04-29 NOTE — Progress Notes (Signed)
The patient is almost a year and a half status post a right total knee arthroplasty.  When I saw him a month ago, he was having some knee swelling.  I aspirated fluid from the knee and place a steroid injection in it.  He says the knee is doing somewhat better overall.  On exam there is still just a mild effusion of his right operative knee.  His range of motion is full and I feel stable on my exam.  He does have chronic lymphedema in his legs.  Is more so on his right side than his left.  He will continue his current level of work and work duties.  I would like to see him back in 6 months with a standing AP and lateral of his right knee.  If there is any issues before then we can see him sooner but I would like new x-rays.

## 2020-07-27 ENCOUNTER — Ambulatory Visit (INDEPENDENT_AMBULATORY_CARE_PROVIDER_SITE_OTHER): Payer: No Typology Code available for payment source | Admitting: Orthopaedic Surgery

## 2020-07-27 ENCOUNTER — Ambulatory Visit (INDEPENDENT_AMBULATORY_CARE_PROVIDER_SITE_OTHER): Payer: No Typology Code available for payment source

## 2020-07-27 DIAGNOSIS — M25561 Pain in right knee: Secondary | ICD-10-CM | POA: Diagnosis not present

## 2020-07-27 DIAGNOSIS — Z96651 Presence of right artificial knee joint: Secondary | ICD-10-CM | POA: Diagnosis not present

## 2020-07-27 NOTE — Progress Notes (Signed)
Office Visit Note   Patient: Joseph Craig           Date of Birth: Oct 23, 1945           MRN: [id number] Visit Date: 07/27/2020              Requested by: No referring provider defined for this encounter. PCP: Patient, No Pcp Per   Assessment & Plan: Visit Diagnoses:  1. Acute pain of right knee   2. History of total right knee replacement     Plan: I talked him at length about his right knee.  I would like to get him into outpatient physical therapy to try to strengthening the muscles around the knee.  He agrees to this treatment plan.  I will see him back in 6 weeks to see how he is doing overall.  Follow-Up Instructions: Return in about 6 weeks (around 09/07/2020).   Orders:  Orders Placed This Encounter  Procedures   XR Knee 1-2 Views Right   No orders of the defined types were placed in this encounter.     Procedures: No procedures performed   Clinical Data: No additional findings.   Subjective: Chief Complaint  Patient presents with   Right Knee - Pain  The patient is over a year and a half out from a right total knee arthroplasty.  He is 75 years old and works very hard still.  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  He says recently the knee has felt heavy to him.  He is walking with a limp and it does hurt.  He has had no other acute change in medical status and denies any recent injuries.  HPI  Review of Systems He currently denies any headache, chest pain, shortness of breath, fever, chills, nausea, vomiting  Objective: Vital Signs: There were no vitals taken for this visit.  Physical Exam He is alert and orient x3 and in no acute distress Ortho Exam Examination of his right knee shows a minimal effusion.  His range of motion is improved from flexion and extension.  It is difficult to get a good ligamentous exam  because his leg is so big.  He is morbidly obese but it does not feel grossly unstable to me. Specialty Comments:  No specialty comments available.  Imaging: XR Knee 1-2 Views Right  Result Date: 07/27/2020 2 views of the right knee show no acute findings with a stable total knee arthroplasty.    PMFS History: Patient Active Problem List   Diagnosis Date Noted   Arthrofibrosis of right total knee arthroplasty ([place]) 02/21/2019   Status post total right knee replacement 11/16/2018   Unilateral primary osteoarthritis, right knee 06/29/2017   Status post arthroscopy of right knee 02/02/2017   Old complex tear of medial meniscus of right knee 12/01/2016   Primary osteoarthritis of right knee 10/14/2016   Unilateral primary osteoarthritis, left knee 08/03/2016   Chronic pain of right knee 08/03/2016   Past Medical History:  Diagnosis Date   Arthritis    Arthrofibrosis of knee joint, right    Cancer ([place])    Skin-Precancerous tag removed   Hypertension    Sinus congestion     No family history on file.  Past Surgical History:  Procedure Laterality Date   CYSTOSCOPY N/A 11/16/2018   Procedure: CYSTOSCOPY FLEXIBLE, ureteral dilation with foley placement;  Surgeon: Mcarthur Rossetti, MD;  Location[facility];  Service: Orthopedics;  Laterality: N/A;   KNEE ARTHROSCOPY Right    KNEE CLOSED REDUCTION Right 02/21/2019   Procedure: CLOSED MANIPULATION RIGHT KNEE;  Surgeon: Mcarthur Rossetti, MD;  Location: [facility];  Service: Orthopedics;  Laterality: Right;   STERIOD INJECTION Right 02/21/2019   Procedure: STEROID INJECTION;  Surgeon: Mcarthur Rossetti, MD;  Location: [facility];  Service: Orthopedics;  Laterality: Right;   TOTAL KNEE ARTHROPLASTY Right 11/16/2018   Procedure: RIGHT TOTAL KNEE ARTHROPLASTY;  Surgeon: Mcarthur Rossetti, MD;  Location[facility];  Service: Orthopedics;  Laterality: Right;  with block   Social  History   Occupational History   Not on file  Tobacco Use   Smoking status: Never Smoker   Smokeless tobacco: Never Used  Vaping Use   Vaping Use: Never used  Substance and Sexual Activity   Alcohol use: Yes    Alcohol/week: 1.0 standard drink    Types: 1 Cans of beer per week    Comment: daily beer   Drug use: Never   Sexual activity: Not on file

## 2020-07-29 ENCOUNTER — Telehealth: Payer: Self-pay

## 2020-07-29 NOTE — Telephone Encounter (Signed)
Faxed to provided number  

## 2020-07-29 NOTE — Telephone Encounter (Signed)
Joseph Craig patients wc case worker is requesting dictation from patients appointment 2/14 to be sent to her [phone] Call [phone]

## 2020-09-07 ENCOUNTER — Ambulatory Visit (INDEPENDENT_AMBULATORY_CARE_PROVIDER_SITE_OTHER): Payer: No Typology Code available for payment source | Admitting: Orthopaedic Surgery

## 2020-09-07 ENCOUNTER — Encounter: Payer: Self-pay | Admitting: Orthopaedic Surgery

## 2020-09-07 DIAGNOSIS — Z96651 Presence of right artificial knee joint: Secondary | ICD-10-CM | POA: Diagnosis not present

## 2020-09-07 NOTE — Progress Notes (Signed)
HPI: [name] returns today follow-up of his right total knee arthroplasty.  He feels physical therapy is definitely helped.  He states he gets on and off equipment at work easier.  Still having considerable swelling in both lower legs but right greater than left.  He is wearing compression hose and also using compression devices at night.  Is having no calf pain.  Review of systems: See HPI.  Physical exam: Right knee full extension flexion to 90 degrees inhibited by calf and thigh circumference.  Surgical incisions healing well no signs of infection.  No gross instability with difficult to assess since ligamentous stability due to the size of his right knee.  Calf is supple nontender.  Impression: Status post right total knee arthroplasty 11/16/2018  Plan: He will continue therapy to work on range of motion strengthening the knee.  Continue compression devices to help with swelling.  See his back in 3 months to see how he is doing overall.  Questions were encouraged and answered by Dr. Ninfa Craig and myself.

## 2020-10-21 ENCOUNTER — Encounter: Payer: Self-pay | Admitting: Orthopaedic Surgery

## 2020-10-21 ENCOUNTER — Ambulatory Visit (INDEPENDENT_AMBULATORY_CARE_PROVIDER_SITE_OTHER): Payer: No Typology Code available for payment source | Admitting: Orthopaedic Surgery

## 2020-10-21 DIAGNOSIS — G8929 Other chronic pain: Secondary | ICD-10-CM | POA: Diagnosis not present

## 2020-10-21 DIAGNOSIS — Z96651 Presence of right artificial knee joint: Secondary | ICD-10-CM

## 2020-10-21 DIAGNOSIS — M25561 Pain in right knee: Secondary | ICD-10-CM

## 2020-10-21 DIAGNOSIS — I878 Other specified disorders of veins: Secondary | ICD-10-CM

## 2020-10-21 MED ORDER — DOXYCYCLINE HYCLATE 100 MG PO TABS: 100.0000 mg | ORAL_TABLET | Freq: Two times a day (BID) | ORAL | 0 refills | Status: DC

## 2020-10-21 NOTE — Progress Notes (Signed)
The patient is well-known to me.  We did a knee replacement on his right knee in June 2020.  He is 75 years old.  He is morbidly obese and has been dealing with venous stasis changes in both his legs.  His knee replacement was actually done due to worsening arthritis after a work-related injury in which he sustained a significant meniscal tear of the right knee.  He says the knee is doing well but has been still dealing with chronic venous stasis changes worse on the right leg than the left.  He has some extensive compressive garments but is difficult for him to do these on.  Examination of his right total knee shows no complicating features of the knee replacement itself.  I actually x-rayed this just 3 months ago and the x-rays look good in terms of the replacement itself.  His knee is cool and the incisions well-healed.  His range of motion is only limited by his obesity.  He does have significant venous stasis changes more on the right leg and left leg and there is lymphedema as well.  There is some slight warmth to this.  From a knee replacement standpoint, there is nothing else that I can recommend for now.  I would like to send him to Dr. Sharol Given for just assessment of his venous stasis changes to see if he has any recommendations in terms of different types of wraps or other compressive garments that could help.  I would at least put him on some doxycycline for some of the cellulitis that is associated with his dermatitis and venous stasis changes.  I would like to see him back myself in 6 weeks to see how he is doing overall.  He will continue his current level of work.

## 2020-10-28 ENCOUNTER — Ambulatory Visit: Payer: BC Managed Care – PPO | Admitting: Orthopaedic Surgery

## 2020-11-07 ENCOUNTER — Other Ambulatory Visit: Payer: Self-pay | Admitting: Orthopaedic Surgery

## 2020-11-12 ENCOUNTER — Encounter: Payer: Self-pay | Admitting: Orthopedic Surgery

## 2020-11-12 ENCOUNTER — Ambulatory Visit (INDEPENDENT_AMBULATORY_CARE_PROVIDER_SITE_OTHER): Payer: No Typology Code available for payment source | Admitting: Orthopedic Surgery

## 2020-11-12 VITALS — Ht 68.5 in | Wt 247.0 lb

## 2020-11-12 DIAGNOSIS — Z96651 Presence of right artificial knee joint: Secondary | ICD-10-CM | POA: Diagnosis not present

## 2020-11-12 DIAGNOSIS — I878 Other specified disorders of veins: Secondary | ICD-10-CM

## 2020-11-22 ENCOUNTER — Encounter: Payer: Self-pay | Admitting: Orthopedic Surgery

## 2020-11-22 NOTE — Progress Notes (Signed)
Office Visit Note   Patient: Joseph Craig           Date of Birth: [birth year]           MRN: [id number] Visit Date: 11/12/2020              Requested by: No referring provider defined for this encounter. PCP: Patient, No Pcp Per (Inactive)  Chief Complaint  Patient presents with   Right Leg - Edema      HPI: Patient is a 75 year old gentleman who was seen in referral from Dr. Ninfa Linden.  Patient is status post a right total knee arthroplasty in 2000 he currently goes to the [facility] for medical treatment.  Patient has been diagnosed with cellulitis and is currently on doxycycline.  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  Assessment & Plan: Visit Diagnoses:  1. History of total right knee replacement   2. Venous stasis of both lower extremities     Plan: Patient states he has received lymphedema pumps from the [facility].  Recommend that he use the lymphedema pumps daily he states that he does have compression stockings and recommended resume the stockings after he uses the lymphedema pumps every morning.  Follow-Up Instructions: Return in about 4 weeks (around 12/10/2020).   Ortho Exam  Patient is alert, oriented, no adenopathy, well-dressed, normal affect, normal respiratory effort. Examination patient has significant venous and lymphatic insufficiency of both lower extremities right worse than left.  There is weeping edema from both lower extremities no cellulitis there is dermatitis with brawny skin color changes.  The right calf is 61 cm in circumference the left calf is 55 cm in circumference  Imaging: No results found. No images are attached to the encounter.  Labs: No results found for: HGBA1C, ESRSEDRATE, CRP, LABURIC, REPTSTATUS, GRAMSTAIN, CULT, LABORGA   No results found for: ALBUMIN, PREALBUMIN, CBC  No results found for: MG No results found for: VD25OH  No results found for: PREALBUMIN CBC EXTENDED Latest Ref Rng &  Units 11/18/2018 11/17/2018 11/08/2018  WBC 4.0 - 10.5 K/uL 12.7(H) 14.6(H) 7.4  RBC 4.22 - 5.81 MIL/uL 3.60(L) 3.79(L) 4.54  HGB 13.0 - 17.0 g/dL 11.8(L) 11.9(L) 14.9  HCT 39.0 - 52.0 % 35.1(L) 37.0(L) 43.7  PLT 150 - 400 K/uL 225 238 267     Body mass index is 37.01 kg/m.  Orders:  No orders of the defined types were placed in this encounter.  No orders of the defined types were placed in this encounter.    Procedures: No procedures performed  Clinical Data: No additional findings.  ROS:  All other systems negative, except as noted in the HPI. Review of Systems  Objective: Vital Signs: Ht 5' 8.5" (1.74 m)   Wt 247 lb (112 kg)   BMI 37.01 kg/m   Specialty Comments:  No specialty comments available.  PMFS History: Patient Active Problem List   Diagnosis Date Noted   Arthrofibrosis of right total knee arthroplasty ([place]) 02/21/2019   Status post total right knee replacement 11/16/2018   Unilateral primary osteoarthritis, right knee 06/29/2017   Status post arthroscopy of right knee 02/02/2017   Old complex tear of medial meniscus of right knee 12/01/2016   Primary osteoarthritis of right knee 10/14/2016   Unilateral primary osteoarthritis, left knee 08/03/2016   Chronic pain of right knee 08/03/2016   Past Medical History:  Diagnosis Date   Arthritis    Arthrofibrosis of knee joint, right  Cancer ([place])    Skin-Precancerous tag removed   Hypertension    Sinus congestion     History reviewed. No pertinent family history.  Past Surgical History:  Procedure Laterality Date   CYSTOSCOPY N/A 11/16/2018   Procedure: CYSTOSCOPY FLEXIBLE, ureteral dilation with foley placement;  Surgeon: Mcarthur Rossetti, MD;  [facility];  Service: Orthopedics;  Laterality: N/A;   KNEE ARTHROSCOPY Right    KNEE CLOSED REDUCTION Right 02/21/2019   Procedure: CLOSED MANIPULATION RIGHT KNEE;  Surgeon: Mcarthur Rossetti, MD;  Location: [facility];   Service: Orthopedics;  Laterality: Right;   STERIOD INJECTION Right 02/21/2019   Procedure: STEROID INJECTION;  Surgeon: Mcarthur Rossetti, MD;  Location: [facility];  Service: Orthopedics;  Laterality: Right;   TOTAL KNEE ARTHROPLASTY Right 11/16/2018   Procedure: RIGHT TOTAL KNEE ARTHROPLASTY;  Surgeon: Mcarthur Rossetti, MD;  Location[facility];  Service: Orthopedics;  Laterality: Right;  with block   Social History   Occupational History   Not on file  Tobacco Use   Smoking status: Never   Smokeless tobacco: Never  Vaping Use   Vaping Use: Never used  Substance and Sexual Activity   Alcohol use: Yes    Alcohol/week: 1.0 standard drink    Types: 1 Cans of beer per week    Comment: daily beer   Drug use: Never   Sexual activity: Not on file

## 2020-12-03 ENCOUNTER — Other Ambulatory Visit: Payer: Self-pay

## 2020-12-03 ENCOUNTER — Encounter: Payer: Self-pay | Admitting: Orthopaedic Surgery

## 2020-12-03 ENCOUNTER — Ambulatory Visit (INDEPENDENT_AMBULATORY_CARE_PROVIDER_SITE_OTHER): Payer: No Typology Code available for payment source | Admitting: Orthopaedic Surgery

## 2020-12-03 DIAGNOSIS — Z96651 Presence of right artificial knee joint: Secondary | ICD-10-CM

## 2020-12-03 DIAGNOSIS — I878 Other specified disorders of veins: Secondary | ICD-10-CM | POA: Diagnosis not present

## 2020-12-03 NOTE — Progress Notes (Signed)
[name] is now 2 years out from a right total knee arthroplasty.  This is the result of an original work-related injury to the right knee when she tore meniscus.  He did have pre-existing arthritis in the right knee that worsened after the meniscal tear.  We then eventually replaced the right knee.  He has been able to work.  His biggest problem is venous stasis disease in both his lower extremities and he follows up with the [facility] and my partner Dr. Sharol Given for that.  From a standpoint of his knee replacement on the right side, it is doing well.  He has good range of motion of that right knee and it is stable ligamentously.  It is not warm at all.  It is nice and cool and the incisions well-healed.  At this standpoint I am releasing him from my services for his right knee.  From a work standpoint, he should not do any work that involves of him being down on his knees and he should not do any work that involves crawling.  He should also not be allowed to climb ladders.  These are permanent restrictions for him.  From a disability rating, he has a permanent partial disability of 25% of the right lower extremity given the fact that he has knee replacement in place but good motion and mobility of that knee replacement.  Follow-up from my standpoint is now as needed.

## 2020-12-09 ENCOUNTER — Telehealth: Payer: Self-pay

## 2020-12-09 NOTE — Telephone Encounter (Signed)
Patients case worker called she is requesting clinical/ov notes from 6/23 fax:[phone] call [phone]

## 2020-12-09 NOTE — Telephone Encounter (Signed)
Patients case worker called she is requesting clinical/ov notes from 6/2 [phone] call [phone]

## 2020-12-10 NOTE — Telephone Encounter (Signed)
Ok to fax this?

## 2020-12-10 NOTE — Telephone Encounter (Signed)
6/2 & 6/23 ov notes faxed Work Comp, Willy Eddy

## 2021-01-06 ENCOUNTER — Telehealth: Payer: Self-pay | Admitting: Physician Assistant

## 2021-01-06 NOTE — Telephone Encounter (Signed)
Pt called stating he would like to get a cortisone inj in his knee because its swelled up again. Pt would like a CB to make sure this will be ok to do at his next appt on 01/13/21?  [phone]

## 2021-01-06 NOTE — Telephone Encounter (Signed)
Is this ok?

## 2021-01-07 NOTE — Telephone Encounter (Signed)
I called and advised pt. He stated understanding. He is scheduled to see Artis Delay on wed.

## 2021-01-13 ENCOUNTER — Ambulatory Visit: Payer: Self-pay

## 2021-01-13 ENCOUNTER — Ambulatory Visit (INDEPENDENT_AMBULATORY_CARE_PROVIDER_SITE_OTHER): Payer: No Typology Code available for payment source | Admitting: Physician Assistant

## 2021-01-13 DIAGNOSIS — M25561 Pain in right knee: Secondary | ICD-10-CM

## 2021-01-13 DIAGNOSIS — Z96651 Presence of right artificial knee joint: Secondary | ICD-10-CM

## 2021-01-13 DIAGNOSIS — G8929 Other chronic pain: Secondary | ICD-10-CM

## 2021-01-13 NOTE — Addendum Note (Signed)
Addended by: Robyne Peers on: 01/13/2021 12:55 PM   Modules accepted: Orders

## 2021-01-13 NOTE — Progress Notes (Signed)
HPI: [name] returns today due to right knee pain.  He underwent a right total knee arthroplasty 11/16/2018.  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  He then developed worsening knee arthritis after the meniscal tear.  He is seen by Dr. Ninfa Linden 12/03/2020 and at that point time was given a disability rating of 25% of the right lower extremity.  Plan was to follow-up as needed.  However unfortunately has developed increasing right knee pain without known injury.  He states knee pain is getting worse he is unable to sleep.  He denies any fevers chills or injury to the knee.  Most of the tenderness to the medial aspect of the knee.  He states it is a throbbing pain.  He does have venous stasis changes both lower extremities.  He uses lymphedema pumps that were provided by the [facility].  He also uses compression stockings knee-high.  Review of systems: See HPI otherwise negative or noncontributory.  Physical exam General well-developed well-nourished pleasant male in no acute distress. Psych: Alert and oriented x3. Lower extremities bilateral edema.  No evidence of cellulitis.  No significant warmth of either knee or lower extremities.  Brawny changes bilateral lower extremities. Right knee good range of motion of the knee.  No gross instability valgus varus stressing.  Surgical incisions well-healed.  Bilateral calves supple nontender.   Radiographs: Right knee 2 views: Knee is well located.  No acute fractures.  No periprosthetic fractures.  No loosening of hardware.  No bony abnormalities otherwise.  Impression: Status post right total knee arthroplasty 11/16/2018 Right knee pain Bilateral lower extremity venous stasis changes  Plan: Given patient's continued pain in his right knee with spiking Will obtain a bone scan to rule out any loosening.  Have him back to go over the bone scan.  Questions encouraged and answered.  We will keep him out of work until August 8 when  he will return to work with his prior restrictions otherwise no change in his work duties.  Questions were encouraged and answered at length today.

## 2021-01-18 ENCOUNTER — Telehealth: Payer: Self-pay

## 2021-01-18 NOTE — Telephone Encounter (Signed)
Pt called and would like to know the status of his bone scan. He would like a call back he is in extreme pain.

## 2021-01-19 ENCOUNTER — Other Ambulatory Visit: Payer: Self-pay | Admitting: Orthopaedic Surgery

## 2021-01-19 MED ORDER — HYDROCODONE-ACETAMINOPHEN 5-325 MG PO TABS: 1.0000 | ORAL_TABLET | Freq: Four times a day (QID) | ORAL | 0 refills | Status: DC | PRN

## 2021-01-19 NOTE — Telephone Encounter (Signed)
Pt asking for a update from the nurse about what he needs to do since he is in severe pain. Pt asked for any medication that can help to ease it until we hear something, pt stated he is taking tylenol and Advil and wanted to know if there was anything else he could get to help. Pt also asking if he can discuss getting a work note for this week since he is unable to work, pt states they sent him home yesterday as it looked so bad. The best call back number is [phone].

## 2021-01-26 ENCOUNTER — Telehealth: Payer: Self-pay | Admitting: Orthopaedic Surgery

## 2021-01-26 NOTE — Telephone Encounter (Signed)
Patient aware this has to be put on a disc from the facility he got the scan from

## 2021-01-26 NOTE — Telephone Encounter (Signed)
Pt called asking for a cd of bone density of his total knee replacement. Please call when ready for pick up. Pt phone number is [phone].

## 2021-01-28 ENCOUNTER — Telehealth: Payer: Self-pay

## 2021-01-28 NOTE — Telephone Encounter (Signed)
Pt called and would like to know if we have received his report from his Bone scan ?

## 2021-02-01 ENCOUNTER — Telehealth: Payer: Self-pay | Admitting: Orthopaedic Surgery

## 2021-02-01 NOTE — Telephone Encounter (Signed)
Patient called. He would like to know the results of his scan. His call back number is [phone]

## 2021-02-03 ENCOUNTER — Telehealth: Payer: Self-pay | Admitting: Orthopaedic Surgery

## 2021-02-03 NOTE — Telephone Encounter (Signed)
Called patient to schedule an appointment with Dr Ninfa Linden for his right leg. Got recording mailbox is full. Will try back later to contact patient.

## 2021-02-04 ENCOUNTER — Ambulatory Visit: Payer: BC Managed Care – PPO | Admitting: Orthopaedic Surgery

## 2021-02-09 ENCOUNTER — Encounter: Payer: Self-pay | Admitting: Orthopaedic Surgery

## 2021-02-09 ENCOUNTER — Other Ambulatory Visit: Payer: Self-pay

## 2021-02-09 ENCOUNTER — Ambulatory Visit (INDEPENDENT_AMBULATORY_CARE_PROVIDER_SITE_OTHER): Payer: No Typology Code available for payment source | Admitting: Orthopaedic Surgery

## 2021-02-09 DIAGNOSIS — M24661 Ankylosis, right knee: Secondary | ICD-10-CM | POA: Diagnosis not present

## 2021-02-09 DIAGNOSIS — I878 Other specified disorders of veins: Secondary | ICD-10-CM | POA: Diagnosis not present

## 2021-02-09 DIAGNOSIS — Z96651 Presence of right artificial knee joint: Secondary | ICD-10-CM

## 2021-02-09 DIAGNOSIS — M25561 Pain in right knee: Secondary | ICD-10-CM | POA: Diagnosis not present

## 2021-02-09 DIAGNOSIS — G8929 Other chronic pain: Secondary | ICD-10-CM

## 2021-02-09 NOTE — Progress Notes (Signed)
Patient is now over 2 years out from a right total knee arthroplasty.  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  He is someone who is also morbidly obese and has significant bilateral lower extremity peripheral edema with pitting edema.  He has been having some pain in that right knee since early August.  We have had to have him out of work due to knee pain and swelling.  Recent x-rays of the knee showed a well-seated implant with no complicating features.  We sent him for a three-phase bone scan of the right knee to rule out prosthetic loosening.  The bone scan was negative for any evidence of prosthetic loosening.  His right knee is still swollen but there is no redness.  Both knees have significant pitting edema and significant lymphedema with venous stasis changes.  I need to keep him out of work and off of his knee for a minimum the next 6 to 8 weeks.  I would like to see him back at that visit with a repeat 2 views of the right knee.  He is inquiring about MRI of the right knee, but I think that that would be difficult to obtain which show enough artifact from the knee replacement but is something we will consider after we see him back in 6 weeks.  I gave him a work note reflecting that we need to have him out of work completely.

## 2021-03-11 ENCOUNTER — Telehealth: Payer: Self-pay | Admitting: Orthopaedic Surgery

## 2021-03-11 ENCOUNTER — Other Ambulatory Visit: Payer: Self-pay | Admitting: Orthopaedic Surgery

## 2021-03-11 IMAGING — DX ABDOMEN - 1 VIEW
2 series · 2 of 2 positions shown · non-contrast
Comparison: None.

CLINICAL DATA: Abdominal distension.

EXAM:
ABDOMEN - 1 VIEW

[abdomen kub (1 of 2)]
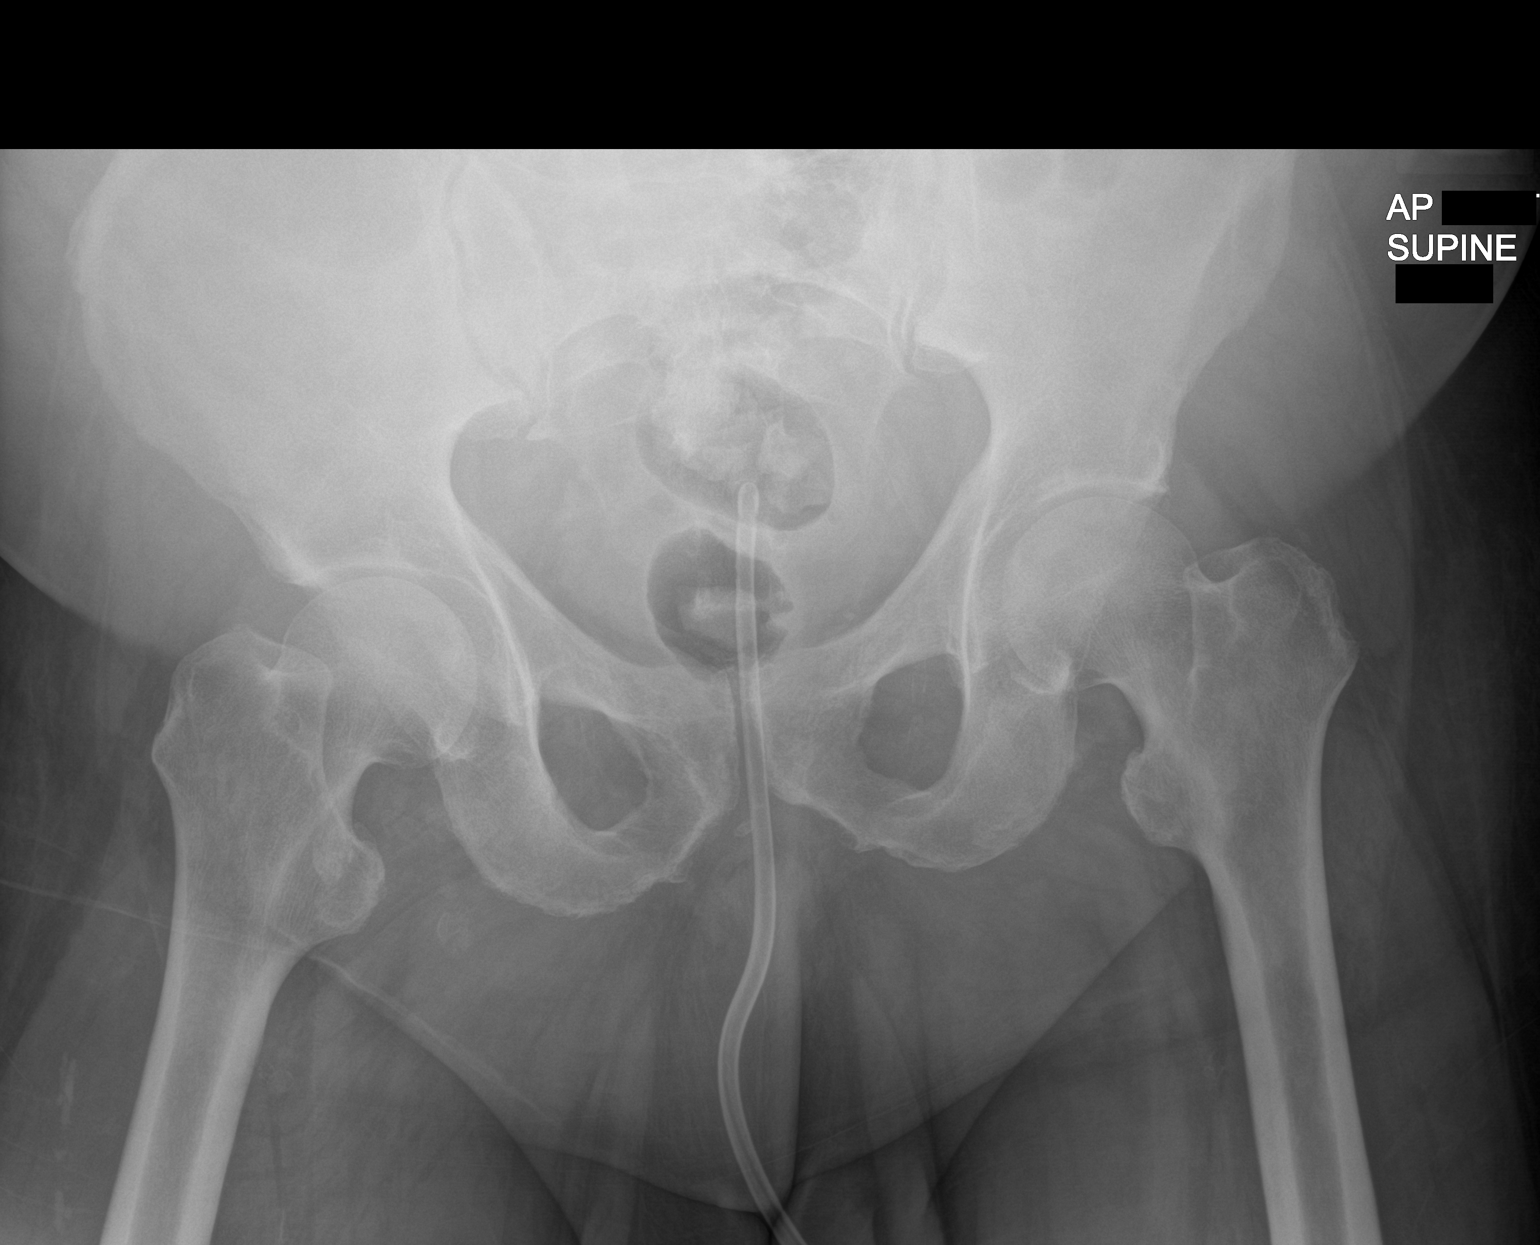

[abdomen kub (2 of 2)]
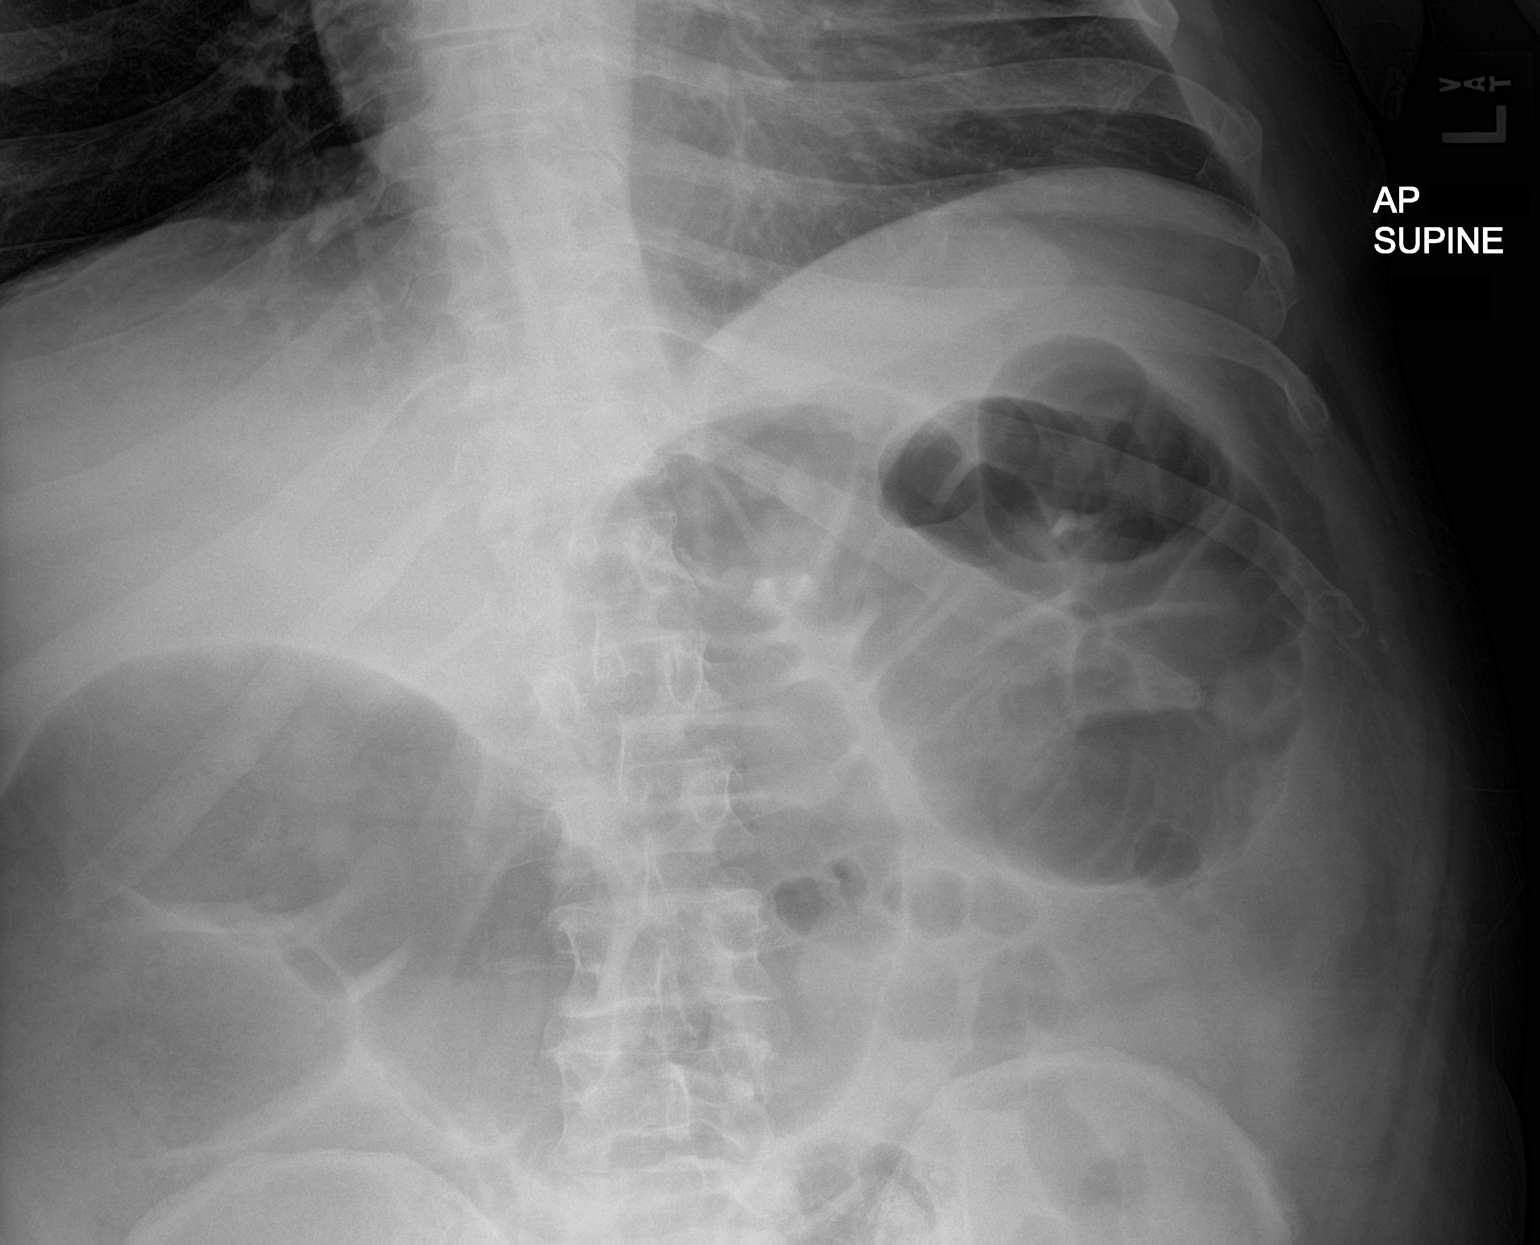

[2 of 2 positions shown; findings below may reference images not displayed]

FINDINGS: Dilated air-filled right and transverse colon is noted. No definite
small bowel dilatation is noted. Rectal tube is noted. No
radio-opaque calculi or other significant radiographic abnormality
are seen.
IMPRESSION: Dilated air-filled right and transverse colon is noted concerning
for possible ileus.

## 2021-03-11 MED ORDER — HYDROCODONE-ACETAMINOPHEN 5-325 MG PO TABS: 1.0000 | ORAL_TABLET | Freq: Three times a day (TID) | ORAL | 0 refills | Status: DC | PRN

## 2021-03-11 NOTE — Telephone Encounter (Signed)
Patient called. He would like a refill on hydrocodone. His call back number is [phone]

## 2021-03-11 NOTE — Telephone Encounter (Signed)
Please advise 

## 2021-03-24 ENCOUNTER — Encounter: Payer: Self-pay | Admitting: Orthopaedic Surgery

## 2021-03-24 ENCOUNTER — Ambulatory Visit (INDEPENDENT_AMBULATORY_CARE_PROVIDER_SITE_OTHER): Payer: No Typology Code available for payment source | Admitting: Orthopaedic Surgery

## 2021-03-24 ENCOUNTER — Ambulatory Visit: Payer: Self-pay

## 2021-03-24 DIAGNOSIS — I878 Other specified disorders of veins: Secondary | ICD-10-CM

## 2021-03-24 DIAGNOSIS — Z96651 Presence of right artificial knee joint: Secondary | ICD-10-CM

## 2021-03-24 MED ORDER — GABAPENTIN 100 MG PO CAPS: 100.0000 mg | ORAL_CAPSULE | Freq: Three times a day (TID) | ORAL | 1 refills | Status: DC

## 2021-03-24 NOTE — Progress Notes (Signed)
The patient continues to have significant knee pain over 2 years out from a right total knee arthroplasty.  He does have bilateral lymphedema and is worse on his right operative side.  He complains of nerve type of pain on the lateral aspect of his right knee.  X-rays today of the right knee show well-seated total knee arthroplasty with no complicating features.  A three-phase bone scan done earlier showed no evidence of prosthetic loosening.  My examination of the knee there is no redness.  He does not have full range of motion and some of this is limited by his body habitus.  He does have a hard time getting up from a chair I think he does have weak quads and his morbid obesity contributes to some of this as well.  I would like to put him on Neurontin 100 mg up to 3 times a day to see how this helps with his nerve symptoms.  We will continue to keep him out of work to see how he adjust to this counter medication.  I will reevaluate him in 4 weeks but no x-rays are needed.  He is inquiring about the potential for an MRI of the right knee but this would show too much scatter in his plain films and bone scan are not indicative of something that needs an MRI including her clinical exam findings.  We will see what it looks like in 4 weeks.

## 2021-04-21 ENCOUNTER — Ambulatory Visit: Payer: BC Managed Care – PPO | Admitting: Orthopaedic Surgery

## 2021-05-10 ENCOUNTER — Ambulatory Visit: Payer: BC Managed Care – PPO | Admitting: Orthopaedic Surgery

## 2021-05-12 ENCOUNTER — Encounter: Payer: Self-pay | Admitting: Orthopaedic Surgery

## 2021-05-12 ENCOUNTER — Ambulatory Visit (INDEPENDENT_AMBULATORY_CARE_PROVIDER_SITE_OTHER): Payer: No Typology Code available for payment source | Admitting: Orthopaedic Surgery

## 2021-05-12 ENCOUNTER — Ambulatory Visit: Payer: BC Managed Care – PPO | Admitting: Orthopaedic Surgery

## 2021-05-12 ENCOUNTER — Other Ambulatory Visit: Payer: Self-pay

## 2021-05-12 DIAGNOSIS — Z96651 Presence of right artificial knee joint: Secondary | ICD-10-CM

## 2021-05-12 DIAGNOSIS — G8929 Other chronic pain: Secondary | ICD-10-CM

## 2021-05-12 DIAGNOSIS — I878 Other specified disorders of veins: Secondary | ICD-10-CM

## 2021-05-12 DIAGNOSIS — M25562 Pain in left knee: Secondary | ICD-10-CM

## 2021-05-12 MED ORDER — METHYLPREDNISOLONE ACETATE 40 MG/ML IJ SUSP
40.0000 mg | INTRAMUSCULAR | Status: AC | PRN
  Administered 2021-05-12: 40 mg via INTRA_ARTICULAR

## 2021-05-12 MED ORDER — LIDOCAINE HCL 1 % IJ SOLN
3.0000 mL | INTRAMUSCULAR | Status: AC | PRN
  Administered 2021-05-12: 3 mL

## 2021-05-12 NOTE — Progress Notes (Signed)
Office Visit Note   Patient: Joseph Craig           Date of Birth: 08-26-1945           MRN: [id number] Visit Date: 05/12/2021              Requested by: No referring provider defined for this encounter. PCP: Patient, No Pcp Per (Inactive)   Assessment & Plan: Visit Diagnoses:  1. History of total right knee replacement   2. Venous stasis of both lower extremities   3. Chronic pain of left knee     Plan: I did place a steroid injection in his left knee today which he tolerated well.  He will continue the gabapentin since that is working for him.  We will continue to keep him out of work and I will reevaluate him in 3 months to see how he is doing overall.  All question concerns were answered and addressed.  Follow-Up Instructions: Return in about 3 months (around 08/10/2021).   Orders:  Orders Placed This Encounter  Procedures   Large Joint Inj: L knee   No orders of the defined types were placed in this encounter.     Procedures: Large Joint Inj: L knee on 05/12/2021 1:44 PM Indications: diagnostic evaluation and pain Details: 22 G 1.5 in needle, superolateral approach  Arthrogram: No  Medications: 3 mL lidocaine 1 %; 40 mg methylPREDNISolone acetate 40 MG/ML Outcome: tolerated well, no immediate complications Procedure, treatment alternatives, risks and benefits explained, specific risks discussed. Consent was given by the patient. Immediately prior to procedure a time out was called to verify the correct patient, procedure, equipment, support staff and site/side marked as required. Patient was prepped and draped in the usual sterile fashion.      Clinical Data: No additional findings.   Subjective: No chief complaint on file. The patient is being seen in follow-up as it relates to his chronic right knee issues.  It has been 2 years and 5 months since we replaced his right knee.  At his last visit I put him on gabapentin 100 mg 3 times a day for neuropathic  type of pain on the lateral aspect of his right knee.  He said that is helped greatly.  Since I saw him last he has been hospitalized.  He has a indwelling Foley catheter is now due to the need for diuresis.  He has lost significant weight since I saw him last due to diuresis.  He is also placed 50 pounds and is walking with a rolling walker.  He is reporting that his knee is feeling much better now.  He does have chronic bilateral lower extremity lymphedema and that is improving.  He is getting wraps placed on his legs.  He has been out of work per my request in order to rest his knee.  His other medical issues are also keeping him out of work acutely.  HPI  Review of Systems Today he denies any chest pain.  He denies any fever, chills, nausea, vomiting  Objective: Vital Signs: There were no vitals taken for this visit.  Physical Exam He is alert and oriented in no acute distress. Ortho Exam Examination of his right operative knee shows that it is cool.  He has improved range of motion of his right knee and there is no swelling.  He does have some subjective numbness to the lateral aspect of the knee.  His right knee is doing much better overall.  His left knee does have some pain and swelling.  He is requesting a steroid injection in his left knee today.  We have not done that before as I can recall. Specialty Comments:  No specialty comments available.  Imaging: No results found.   PMFS History: Patient Active Problem List   Diagnosis Date Noted   Arthrofibrosis of right total knee arthroplasty ([place]) 02/21/2019   Status post total right knee replacement 11/16/2018   Unilateral primary osteoarthritis, right knee 06/29/2017   Status post arthroscopy of right knee 02/02/2017   Old complex tear of medial meniscus of right knee 12/01/2016   Primary osteoarthritis of right knee 10/14/2016   Unilateral primary osteoarthritis, left knee 08/03/2016   Chronic pain of right knee 08/03/2016    Past Medical History:  Diagnosis Date   Arthritis    Arthrofibrosis of knee joint, right    Cancer ([place])    Skin-Precancerous tag removed   Hypertension    Sinus congestion     History reviewed. No pertinent family history.  Past Surgical History:  Procedure Laterality Date   CYSTOSCOPY N/A 11/16/2018   Procedure: CYSTOSCOPY FLEXIBLE, ureteral dilation with foley placement;  Surgeon: Mcarthur Rossetti, MD;  Location: [facility];  Service: Orthopedics;  Laterality: N/A;   KNEE ARTHROSCOPY Right    KNEE CLOSED REDUCTION Right 02/21/2019   Procedure: CLOSED MANIPULATION RIGHT KNEE;  Surgeon: Mcarthur Rossetti, MD;  Location: [facility];  Service: Orthopedics;  Laterality: Right;   STERIOD INJECTION Right 02/21/2019   Procedure: STEROID INJECTION;  Surgeon: Mcarthur Rossetti, MD;  Location: [facility] [facility];  Service: Orthopedics;  Laterality: Right;   TOTAL KNEE ARTHROPLASTY Right 11/16/2018   Procedure: RIGHT TOTAL KNEE ARTHROPLASTY;  Surgeon: Mcarthur Rossetti, MD;  Location: [facility];  Service: Orthopedics;  Laterality: Right;  with block   Social History   Occupational History   Not on file  Tobacco Use   Smoking status: Never   Smokeless tobacco: Never  Vaping Use   Vaping Use: Never used  Substance and Sexual Activity   Alcohol use: Yes    Alcohol/week: 1.0 standard drink    Types: 1 Cans of beer per week    Comment: daily beer   Drug use: Never   Sexual activity: Not on file

## 2021-05-25 ENCOUNTER — Other Ambulatory Visit: Payer: Self-pay | Admitting: Orthopaedic Surgery

## 2021-07-22 ENCOUNTER — Other Ambulatory Visit: Payer: Self-pay | Admitting: Orthopaedic Surgery

## 2021-07-28 ENCOUNTER — Encounter: Payer: Self-pay | Admitting: Orthopaedic Surgery

## 2021-07-28 ENCOUNTER — Other Ambulatory Visit: Payer: Self-pay

## 2021-07-28 ENCOUNTER — Ambulatory Visit: Payer: Self-pay

## 2021-07-28 ENCOUNTER — Ambulatory Visit (INDEPENDENT_AMBULATORY_CARE_PROVIDER_SITE_OTHER): Payer: BC Managed Care – PPO | Admitting: Orthopaedic Surgery

## 2021-07-28 DIAGNOSIS — M25562 Pain in left knee: Secondary | ICD-10-CM

## 2021-07-28 MED ORDER — LIDOCAINE HCL 1 % IJ SOLN
3.0000 mL | INTRAMUSCULAR | Status: AC | PRN
  Administered 2021-07-28: 3 mL

## 2021-07-28 MED ORDER — METHYLPREDNISOLONE ACETATE 40 MG/ML IJ SUSP
40.0000 mg | INTRAMUSCULAR | Status: AC | PRN
  Administered 2021-07-28: 40 mg via INTRA_ARTICULAR

## 2021-07-28 NOTE — Progress Notes (Signed)
Office Visit Note   Patient: Joseph Craig           Date of Birth: 1946-05-21           MRN: [id number] Visit Date: 07/28/2021              Requested by: No referring provider defined for this encounter. PCP: Patient, No Pcp Per (Inactive)   Assessment & Plan: Visit Diagnoses:  1. Acute pain of left knee     Plan: We will see him back at his regularly scheduled appointment for follow-up of his right total knee arthroplasty in 2 weeks we will reassess his left knee at that time.  He continues to have pain despite a cortisone injection and he may require an MRI to rule out meniscal tear or occult fracture.  Questions encouraged and answered at length by Dr. Ninfa Craig and myself  Follow-Up Instructions: Return in about 2 weeks (around 08/11/2021).   Orders:  Orders Placed This Encounter  Procedures   Large Joint Inj   XR Knee 1-2 Views Left   No orders of the defined types were placed in this encounter.     Procedures: Large Joint Inj: L knee on 07/28/2021 9:37 AM Medications: 3 mL lidocaine 1 %; 40 mg methylPREDNISolone acetate 40 MG/ML Consent was given by the patient. Immediately prior to procedure a time out was called to verify the correct patient, procedure, equipment, support staff and site/side marked as required. Patient was prepped and draped in the usual sterile fashion.      Clinical Data: No additional findings.   Subjective: Chief Complaint  Patient presents with   Left Knee - Pain    HPI [name] is well-known to Dr. Delilah Craig service comes in today due to left knee pain that is been ongoing since he fell sometime before Christmas.  He reports that he tripped on a curb and fell onto his left knee since that time he had decreased range of motion and stiffness in the knee.  He denies any mechanical symptoms of the knee.  Pain is 7 out of 10 at worst.  Is worse in the morning and when walking for prolonged period time or when getting into a motor vehicle.   History of right total knee arthroplasty 11/16/2018 which she states is overall doing well occasional leg pain.     Review of Systems  Constitutional:  Negative for chills and fever.    Objective: Vital Signs: There were no vitals taken for this visit.  Physical Exam Constitutional:      Appearance: He is not ill-appearing or diaphoretic.  Pulmonary:     Effort: Pulmonary effort is normal.  Neurological:     Mental Status: He is alert.  Psychiatric:        Mood and Affect: Mood normal.    Ortho Exam Left knee full extension flexion to 90 degrees.  Tenderness along medial joint line no instability valgus varus stressing.  No abnormal warmth erythema or effusion no skin abrasions or skin breakdown.  Bilateral lower extremities with in the boot type bandages which were not removed today. Specialty Comments:  No specialty comments available.  Imaging: XR Knee 1-2 Views Left  Result Date: 07/28/2021 Left knee 2 views: No acute fractures.  Fabella present.  Slight narrowing medial joint line.  Otherwise knee is well-preserved.  No bony abnormalities otherwise    PMFS History: Patient Active Problem List   Diagnosis Date Noted   Arthrofibrosis of right total knee  arthroplasty ([place]) 02/21/2019   Status post total right knee replacement 11/16/2018   Unilateral primary osteoarthritis, right knee 06/29/2017   Status post arthroscopy of right knee 02/02/2017   Old complex tear of medial meniscus of right knee 12/01/2016   Primary osteoarthritis of right knee 10/14/2016   Unilateral primary osteoarthritis, left knee 08/03/2016   Chronic pain of right knee 08/03/2016   Past Medical History:  Diagnosis Date   Arthritis    Arthrofibrosis of knee joint, right    Cancer ([place])    Skin-Precancerous tag removed   Hypertension    Sinus congestion     No family history on file.  Past Surgical History:  Procedure Laterality Date   CYSTOSCOPY N/A 11/16/2018   Procedure: CYSTOSCOPY  FLEXIBLE, ureteral dilation with foley placement;  Surgeon: Joseph Rossetti, MD;  Location: [facility];  Service: Orthopedics;  Laterality: N/A;   KNEE ARTHROSCOPY Right    KNEE CLOSED REDUCTION Right 02/21/2019   Procedure: CLOSED MANIPULATION RIGHT KNEE;  Surgeon: Joseph Rossetti, MD;  Location: [facility];  Service: Orthopedics;  Laterality: Right;   STERIOD INJECTION Right 02/21/2019   Procedure: STEROID INJECTION;  Surgeon: Joseph Rossetti, MD;  Location: [facility];  Service: Orthopedics;  Laterality: Right;   TOTAL KNEE ARTHROPLASTY Right 11/16/2018   Procedure: RIGHT TOTAL KNEE ARTHROPLASTY;  Surgeon: Joseph Rossetti, MD;  Location: [facility];  Service: Orthopedics;  Laterality: Right;  with block   Social History   Occupational History   Not on file  Tobacco Use   Smoking status: Never   Smokeless tobacco: Never  Vaping Use   Vaping Use: Never used  Substance and Sexual Activity   Alcohol use: Yes    Alcohol/week: 1.0 standard drink    Types: 1 Cans of beer per week    Comment: daily beer   Drug use: Never   Sexual activity: Not on file

## 2021-08-10 ENCOUNTER — Ambulatory Visit (INDEPENDENT_AMBULATORY_CARE_PROVIDER_SITE_OTHER): Payer: BC Managed Care – PPO | Admitting: Orthopaedic Surgery

## 2021-08-10 ENCOUNTER — Encounter: Payer: Self-pay | Admitting: Orthopaedic Surgery

## 2021-08-10 ENCOUNTER — Telehealth: Payer: Self-pay

## 2021-08-10 DIAGNOSIS — Z96651 Presence of right artificial knee joint: Secondary | ICD-10-CM | POA: Diagnosis not present

## 2021-08-10 DIAGNOSIS — M25562 Pain in left knee: Secondary | ICD-10-CM

## 2021-08-10 NOTE — Telephone Encounter (Signed)
Joseph Craig, we wrote a new note for him today-no work at all. Can we send that to his WC? Mickel Baas I believe he said her name was?

## 2021-08-10 NOTE — Progress Notes (Signed)
[name] is now getting close to 3 years status post a right total knee arthroplasty.  His original injury to the right knee was work-related and occurred several years ago with a significant meniscal tear.  He has had some arthritis in that knee but then it significantly worsened after the meniscal tear.  He eventually had a knee replacement in June 2020.  He has had a long and difficult recovery from his knee replacement.  He has been dealing also now with chronic left knee pain.  He has severe lymphedema in both his legs and this requires wraps on both legs.  He has other comorbidities that at this point are prohibiting him from being able to perform any type of regular work.  On my exam today he still is dealing with chronic lymphedema and wraps on both of his legs.  His right knee replacement is stable with good range of motion.  His left knee does feel better after recent injection.  He has significant weakness in his quads and bilateral lower extremities overall.  I believe he is a fall risk.  This point he needs to be on permanent work disability.  I would not be comfortable with him performing any type of work that involves climbing ladders or walking long distances.  He cannot stoop crawl.  He cannot lift anything from the ground no greater than 20 pounds.  He cannot stand for long periods of time.  All this is related to a multitude of factors and definitely there is legitimate issues for him.  He has always been compliant with but we have asked him and gone through all the therapy.  He is working on his health overall.  He is 76 years old and has been very forthright with his medical issues and his ability to try to get back level of function that allows him to work.  However, it is not safe for him to get back to any type of work and, due to his comorbidities, needs to be on permanent work disability.  All questions and concerns were answered and addressed.  Follow-up will be as needed.

## 2021-10-07 ENCOUNTER — Other Ambulatory Visit: Payer: Self-pay | Admitting: Orthopaedic Surgery

## 2021-12-20 ENCOUNTER — Encounter: Payer: Self-pay | Admitting: Physician Assistant

## 2021-12-20 ENCOUNTER — Ambulatory Visit (INDEPENDENT_AMBULATORY_CARE_PROVIDER_SITE_OTHER): Payer: No Typology Code available for payment source | Admitting: Physician Assistant

## 2021-12-20 DIAGNOSIS — M7051 Other bursitis of knee, right knee: Secondary | ICD-10-CM | POA: Diagnosis not present

## 2021-12-20 DIAGNOSIS — M7050 Other bursitis of knee, unspecified knee: Secondary | ICD-10-CM

## 2021-12-20 MED ORDER — METHYLPREDNISOLONE ACETATE 40 MG/ML IJ SUSP
20.0000 mg | INTRAMUSCULAR | Status: AC | PRN
  Administered 2021-12-20: 20 mg via INTRAMUSCULAR

## 2021-12-20 MED ORDER — LIDOCAINE HCL 1 % IJ SOLN
0.5000 mL | INTRAMUSCULAR | Status: AC | PRN
  Administered 2021-12-20: .5 mL

## 2021-12-20 NOTE — Progress Notes (Signed)
HPI: [name] is well-known to Dr. Trevor Mace service with a history of a right total knee arthroplasty 11/16/2018.  He is having pain mainly in the medial aspect of the knee with some tingling sensation over the last week.  He does have chronic lymphedema bilateral legs.  He states he has some new compression stockings that are being ordered through the [place].  Does report that he tripped 3 to 4 months ago but has been seen since that time.  No acute injury otherwise.  He notes swelling about the right knee.  Denies any fevers chills.  Has been fitted with new shoes and orthotics.  These because of foot pain is slowly improving with wear.  Review of systems see HPI otherwise negative  Physical exam: General well-developed well-nourished male ambulates without any assistive device with an antalgic gait. Bilateral knees good range of motion of both knees.  Tenderness right knee over the pes anserinus region.  Right knee surgical incisions well-healed.  He has lymphedema changes but bilateral lower extremities.  Impression: Pes Anserinus right knee  Plan: He was given a handicap permit.  He will follow-up with [place] as needed.  He tolerated the injection well today.  Encouraged him to continue to try to wear compression stockings as much as possible.  Questions were encouraged and answered.  Procedure Note  Patient: Joseph Craig             Date of Birth: 04-06-1946           MRN: [id number]             Visit Date: 12/20/2021  Procedures: Visit Diagnoses: Pes Anserinus  Trigger Point Inj  Date/Time: 12/20/2021 11:02 AM  Performed by: Pete Pelt, PA-C Authorized by: Pete Pelt, PA-C   Indications:  Pain Total # of Trigger Points:  1 Location: lower extremity   Approach:  Medial Medications #1:  0.5 mL lidocaine 1 %; 20 mg methylPREDNISolone acetate 40 MG/ML

## 2022-01-10 ENCOUNTER — Other Ambulatory Visit: Payer: Self-pay | Admitting: Physician Assistant

## 2022-01-10 DIAGNOSIS — Z96651 Presence of right artificial knee joint: Secondary | ICD-10-CM

## 2022-01-10 DIAGNOSIS — G8929 Other chronic pain: Secondary | ICD-10-CM

## 2022-03-03 ENCOUNTER — Other Ambulatory Visit: Payer: Self-pay | Admitting: Orthopaedic Surgery

## 2022-04-13 ENCOUNTER — Encounter: Payer: Self-pay | Admitting: Orthopaedic Surgery

## 2022-04-13 ENCOUNTER — Ambulatory Visit (INDEPENDENT_AMBULATORY_CARE_PROVIDER_SITE_OTHER): Payer: No Typology Code available for payment source

## 2022-04-13 ENCOUNTER — Ambulatory Visit (INDEPENDENT_AMBULATORY_CARE_PROVIDER_SITE_OTHER): Payer: No Typology Code available for payment source | Admitting: Orthopaedic Surgery

## 2022-04-13 DIAGNOSIS — Z96651 Presence of right artificial knee joint: Secondary | ICD-10-CM

## 2022-04-13 DIAGNOSIS — M7050 Other bursitis of knee, unspecified knee: Secondary | ICD-10-CM | POA: Diagnosis not present

## 2022-04-13 MED ORDER — METHYLPREDNISOLONE ACETATE 40 MG/ML IJ SUSP
40.0000 mg | INTRAMUSCULAR | Status: AC | PRN
  Administered 2022-04-13: 40 mg via INTRAMUSCULAR

## 2022-04-13 MED ORDER — LIDOCAINE HCL 1 % IJ SOLN
1.0000 mL | INTRAMUSCULAR | Status: AC | PRN
  Administered 2022-04-13: 1 mL

## 2022-04-13 NOTE — Progress Notes (Signed)
   Procedure Note  Patient: Joseph Craig             Date of Birth: [birth year]           MRN: [id number]             Visit Date: 04/13/2022 HPI: [name] returns today stating that the pes anserinus injection at last visit gave him good relief until recently.  He is having pain over the anterior aspect of the knee again.  No known injury.  Review of systems: See HPI otherwise negative  Physical exam: General well-developed well-nourished pleasant male in no acute distress ambulates with a slight antalgic gait. Right knee: Surgical incisions well-healed.  Full extension flexion to beyond 90 degrees.  Tenderness over the pes anserinus area.  No instability valgus varus stressing.  Procedures: Visit Diagnoses:  1. History of total right knee replacement   2. Pes anserine bursitis     Trigger Point Inj  Date/Time: 04/13/2022 9:04 AM  Performed by: Pete Pelt, PA-C Authorized by: Pete Pelt, PA-C   Consent Given by:  Patient Site marked: the procedure site was marked   Indications:  Pain Total # of Trigger Points:  1 Location: lower extremity   Needle Size:  25 G Medications #1:  1 mL lidocaine 1 %; 40 mg methylPREDNISolone acetate 40 MG/ML  Radiographs:Right knee 2 views: Knee is located.  Status post right knee arthroplasty with well seated components.  No acute fractures.  No bony abnormalities.  Plan: Recommend that he go to formal therapy for modalities over the pes anserinus area hamstring stretching.  We will see him back in 6 weeks see how he is doing overall.  Questions were encouraged and answered by Dr. Delilah Shan myself.

## 2022-05-16 ENCOUNTER — Ambulatory Visit (INDEPENDENT_AMBULATORY_CARE_PROVIDER_SITE_OTHER): Payer: No Typology Code available for payment source | Admitting: Physician Assistant

## 2022-05-16 ENCOUNTER — Encounter: Payer: Self-pay | Admitting: Physician Assistant

## 2022-05-16 DIAGNOSIS — G8929 Other chronic pain: Secondary | ICD-10-CM | POA: Diagnosis not present

## 2022-05-16 DIAGNOSIS — M25561 Pain in right knee: Secondary | ICD-10-CM | POA: Diagnosis not present

## 2022-05-16 DIAGNOSIS — M7050 Other bursitis of knee, unspecified knee: Secondary | ICD-10-CM | POA: Diagnosis not present

## 2022-05-16 DIAGNOSIS — Z96651 Presence of right artificial knee joint: Secondary | ICD-10-CM | POA: Diagnosis not present

## 2022-05-16 NOTE — Progress Notes (Signed)
HPI: [name] comes in today for follow-up of his right knee which she underwent total knee arthroplasty on 11/16/2018.  Had a pes injection on 04/13/2022 on the right knee.  States this helped some.  He has not started physical therapy as of yet and is due to start tomorrow at 1 PM.  He states that knee pain is "not bad".  Again he has chronic lymphedema bilateral legs.  Does not have his compression hose on today.  Review of systems: See HPI otherwise negative  Physical exam: General well-developed well-nourished male no acute distress. Bilateral knees: Good range of motion bilateral knees.  Tenderness over the right knee pes anserinus region.  Bilateral lower leg edema right greater than left.  Plan: We will have him go to formal physical therapy for his right knee to work on range of motion strengthening.  He will work on hamstring stretching.  Modalities will be included.  He will follow-up with [place] in 8 weeks see how he is doing overall.  Questions were encouraged and answered

## 2022-05-17 MED ORDER — DICLOFENAC SODIUM 1 % EX GEL: 4.0000 g | Freq: Four times a day (QID) | CUTANEOUS | Status: AC

## 2022-05-17 MED ORDER — DICLOFENAC SODIUM 1 % EX GEL
4.0000 g | Freq: Four times a day (QID) | CUTANEOUS | 1 refills | Status: AC
Start: 2022-05-17 — End: ?

## 2022-05-17 NOTE — Addendum Note (Signed)
Addended by: Robyne Peers on: 05/17/2022 09:53 AM   Modules accepted: Orders

## 2022-05-17 NOTE — Addendum Note (Signed)
Addended by: Robyne Peers on: 05/17/2022 09:50 AM   Modules accepted: Orders

## 2022-06-04 ENCOUNTER — Other Ambulatory Visit: Payer: Self-pay | Admitting: Orthopaedic Surgery

## 2022-07-18 ENCOUNTER — Encounter: Payer: Self-pay | Admitting: Physician Assistant

## 2022-07-18 ENCOUNTER — Ambulatory Visit (INDEPENDENT_AMBULATORY_CARE_PROVIDER_SITE_OTHER): Payer: No Typology Code available for payment source | Admitting: Physician Assistant

## 2022-07-18 DIAGNOSIS — M7050 Other bursitis of knee, unspecified knee: Secondary | ICD-10-CM | POA: Diagnosis not present

## 2022-07-18 DIAGNOSIS — Z96651 Presence of right artificial knee joint: Secondary | ICD-10-CM

## 2022-07-18 NOTE — Progress Notes (Signed)
Office Visit Note   Patient: Joseph Craig           Date of Birth: 1945/09/26           MRN: [id number] Visit Date: 07/18/2022              Requested by: No referring provider defined for this encounter. PCP: Patient, No Pcp Per HPI: Mr. Tangen returns today for follow-up of his right knee.  He states that he got to physical therapy but then had a fall and scraped his leg up he is now being seen in the wound clinic for mid right tibial wound.  He initially was placed on antibiotics by the urgent care [facility] and then seen at wound clinic at the [place].  He is going to have dressings changed tomorrow.  He was told to stop therapy until he could come back and see [place] in the office.  He has had no fevers or chills.  He is wearing compression garments on both legs.  Review of systems see HPI otherwise negative  Physical exam: General no acute distress mood and affect appropriate.  Ambulates with a slight antalgic gait. Right knee: Surgical incisions well-healed no signs of infection.  Good range of motion knee without pain.  Tenderness over the pes anserinus area.  Right lower leg anterior mid tibia region there is a large abrasion with good granular tissue.  No signs of gross infection.  Calf supple.  Bilateral lower extremity venous insufficiency changes.  Assessment & Plan: He will continue to follow-up with the wound care center for care of the wound at mid tibia.  Would like to see him back in just 2 weeks to make sure that his wound is healing appropriately.  He can begin physical therapy again for stretching of his hamstrings and range of motion of the knee.  Prescription was given.  Questions were encouraged and answered by Dr. Ninfa Linden and myself.   Visit Diagnoses:  1. History of total right knee replacement   2. Pes anserine bursitis       Follow-Up Instructions: Return in about 2 weeks (around 08/01/2022).   Orders:  No orders of the defined types were placed in this  encounter.  No orders of the defined types were placed in this encounter.                                                                                                                                                        Procedures: No procedures performed   Clinical Data: No additional findings.   Subjective: Chief Complaint  Patient presents with   Right Knee - Pain    HPI  Review of Systems   Objective: Vital Signs: There were no vitals taken for this visit.  Physical  Exam  Ortho Exam  Specialty Comments:  No specialty comments available.  Imaging: No results found.   PMFS History: Patient Active Problem List   Diagnosis Date Noted   Arthrofibrosis of right total knee arthroplasty ([place]) 02/21/2019   Status post total right knee replacement 11/16/2018   Unilateral primary osteoarthritis, right knee 06/29/2017   Status post arthroscopy of right knee 02/02/2017   Old complex tear of medial meniscus of right knee 12/01/2016   Primary osteoarthritis of right knee 10/14/2016   Unilateral primary osteoarthritis, left knee 08/03/2016   Chronic pain of right knee 08/03/2016   Past Medical History:  Diagnosis Date   Arthritis    Arthrofibrosis of knee joint, right    Cancer ([place])    Skin-Precancerous tag removed   Hypertension    Sinus congestion     History reviewed. No pertinent family history.  Past Surgical History:  Procedure Laterality Date   CYSTOSCOPY N/A 11/16/2018   Procedure: CYSTOSCOPY FLEXIBLE, ureteral dilation with foley placement;  Surgeon: Mcarthur Rossetti, MD;  Location: [facility];  Service: Orthopedics;  Laterality: N/A;   KNEE ARTHROSCOPY Right    KNEE CLOSED REDUCTION Right 02/21/2019   Procedure: CLOSED MANIPULATION RIGHT KNEE;  Surgeon: Mcarthur Rossetti, MD;  Location: [place];  Service: Orthopedics;  Laterality: Right;   STERIOD INJECTION Right 02/21/2019   Procedure: STEROID INJECTION;  Surgeon: Mcarthur Rossetti, MD;  Location: [place];  Service: Orthopedics;  Laterality: Right;   TOTAL KNEE ARTHROPLASTY Right 11/16/2018   Procedure: RIGHT TOTAL KNEE ARTHROPLASTY;  Surgeon: Mcarthur Rossetti, MD;  Location: [facility];  Service: Orthopedics;  Laterality: Right;  with block   Social History   Occupational History   Not on file  Tobacco Use   Smoking status: Never   Smokeless tobacco: Never  Vaping Use   Vaping Use: Never used  Substance and Sexual Activity   Alcohol use: Yes    Alcohol/week: 1.0 standard drink of alcohol    Types: 1 Cans of beer per week    Comment: daily beer   Drug use: Never   Sexual activity: Not on file

## 2022-08-01 ENCOUNTER — Encounter: Payer: Self-pay | Admitting: Physician Assistant

## 2022-08-01 ENCOUNTER — Ambulatory Visit (INDEPENDENT_AMBULATORY_CARE_PROVIDER_SITE_OTHER): Payer: No Typology Code available for payment source | Admitting: Physician Assistant

## 2022-08-01 DIAGNOSIS — R600 Localized edema: Secondary | ICD-10-CM

## 2022-08-01 DIAGNOSIS — M7050 Other bursitis of knee, unspecified knee: Secondary | ICD-10-CM

## 2022-08-01 DIAGNOSIS — Z96651 Presence of right artificial knee joint: Secondary | ICD-10-CM

## 2022-08-01 NOTE — Progress Notes (Signed)
HPI: [name] returns today for follow-up of his right knee mainly for the wound that he has the anterior aspect of the lower leg.  He states that the [place] has released him.  He does have follow-up with urgent care for the wound next Wednesday.  He is planning to meet with silver embedded tight dressing to the area.  He continues to wear his compression stockings.  [facility] he has therapy set up for 2 times a week.  He feels that therapy is beneficial and that overall his knee feels much better.  Review of systems see HPI otherwise negative  Physical exam: General well-developed well-nourished male who ambulates without any assistive device. Right knee full extension full flexion.  Slight tenderness over the pes anserinus area.  The right lower leg large abrasion has good granular tissue and no signs of gross infection.  Bilateral lower legs with venous insufficiency brawny changes.  Right lower extremity edema changes are worse in the right leg than the left.   Impression: Pes anserinus syndrome Bilateral lower extremity edema  Plan: He will continue to work with the wound care at the urgent care center.  He will continue to go to therapy.  Continue his compression stockings.  Follow-up with [place] in 8 weeks after he is finished therapy to see how he is doing overall.  Sooner if there is any questions or concerns.

## 2022-09-13 ENCOUNTER — Other Ambulatory Visit: Payer: Self-pay | Admitting: Orthopaedic Surgery

## 2022-09-26 ENCOUNTER — Encounter: Payer: Self-pay | Admitting: Physician Assistant

## 2022-09-26 ENCOUNTER — Ambulatory Visit (INDEPENDENT_AMBULATORY_CARE_PROVIDER_SITE_OTHER): Payer: No Typology Code available for payment source | Admitting: Physician Assistant

## 2022-09-26 DIAGNOSIS — Z96651 Presence of right artificial knee joint: Secondary | ICD-10-CM

## 2022-09-26 NOTE — Progress Notes (Signed)
Office Visit Note   Patient: Joseph Craig           Date of Birth: [birth year]           MRN: [id number] Visit Date: 09/26/2022              Requested by: No referring provider defined for this encounter. PCP: Patient, No Pcp Per   Assessment & Plan: Visit Diagnoses:  1. History of total right knee replacement     Plan: He will start taking his gabapentin 3 times daily.  Also use Voltaren gel over the pes anserinus area of the right knee 4 g 4 times daily for 2 weeks and then as needed.  He will follow-up with [place] in 3 months sooner if there is any questions concerns.  Follow-Up Instructions: No follow-ups on file.   Orders:  No orders of the defined types were placed in this encounter.  No orders of the defined types were placed in this encounter.     Procedures: No procedures performed   Clinical Data: No additional findings.   Subjective: Chief Complaint  Patient presents with   Right Knee - Pain    HPI Joseph Craig comes in today for follow-up of his right knee.  He states that physical therapy has helped.  He finished therapy last week.  He states he has soreness in the knee.  He is taking extra strength Tylenol, gabapentin and using Voltaren gel occasionally from the knee.  He continues to use his compressive garments for both legs.  No new injury to either knee.  Review of Systems See HPI otherwise negative  Objective: Vital Signs: There were no vitals taken for this visit.  Physical Exam Constitutional:      Appearance: He is not ill-appearing.  Neurological:     Mental Status: He is alert and oriented to person, place, and time.  Psychiatric:        Mood and Affect: Mood normal.    Ortho Exam Right knee: Well-healed surgical incision.  Full extension.  Flexion beyond 90 degrees.  No gross instability valgus varus stressing of the right knee.  Tenderness over the right pes anserinus region.  Edematous changes bilateral lower extremities.  Ambulates  without any assistive device. Specialty Comments:  No specialty comments available.  Imaging: No results found.   PMFS History: Patient Active Problem List   Diagnosis Date Noted   Arthrofibrosis of right total knee arthroplasty ([facility]) 02/21/2019   Status post total right knee replacement 11/16/2018   Unilateral primary osteoarthritis, right knee 06/29/2017   Status post arthroscopy of right knee 02/02/2017   Old complex tear of medial meniscus of right knee 12/01/2016   Primary osteoarthritis of right knee 10/14/2016   Unilateral primary osteoarthritis, left knee 08/03/2016   Chronic pain of right knee 08/03/2016   Past Medical History:  Diagnosis Date   Arthritis    Arthrofibrosis of knee joint, right    Cancer    Skin-Precancerous tag removed   Hypertension    Sinus congestion     History reviewed. No pertinent family history.  Past Surgical History:  Procedure Laterality Date   CYSTOSCOPY N/A 11/16/2018   Procedure: CYSTOSCOPY FLEXIBLE, ureteral dilation with foley placement;  Surgeon: Kathryne Hitch, MD;  Location: [facility];  Service: Orthopedics;  Laterality: N/A;   KNEE ARTHROSCOPY Right    KNEE CLOSED REDUCTION Right 02/21/2019   Procedure: CLOSED MANIPULATION RIGHT KNEE;  Surgeon: Kathryne Hitch, MD;  Location: [facility];  Service: Orthopedics;  Laterality: Right;   STERIOD INJECTION Right 02/21/2019   Procedure: STEROID INJECTION;  Surgeon: Kathryne Hitch, MD;  Location: [facility];  Service: Orthopedics;  Laterality: Right;   TOTAL KNEE ARTHROPLASTY Right 11/16/2018   Procedure: RIGHT TOTAL KNEE ARTHROPLASTY;  Surgeon: Kathryne Hitch, MD;  Location: [facility];  Service: Orthopedics;  Laterality: Right;  with block   Social History   Occupational History   Not on file  Tobacco Use   Smoking status: Never   Smokeless tobacco: Never  Vaping Use   Vaping Use: Never used  Substance and Sexual Activity    Alcohol use: Yes    Alcohol/week: 1.0 standard drink of alcohol    Types: 1 Cans of beer per week    Comment: daily beer   Drug use: Never   Sexual activity: Not on file

## 2022-12-13 ENCOUNTER — Other Ambulatory Visit: Payer: Self-pay | Admitting: Orthopaedic Surgery

## 2022-12-26 ENCOUNTER — Ambulatory Visit (INDEPENDENT_AMBULATORY_CARE_PROVIDER_SITE_OTHER): Payer: No Typology Code available for payment source | Admitting: Orthopaedic Surgery

## 2022-12-26 ENCOUNTER — Encounter: Payer: Self-pay | Admitting: Orthopaedic Surgery

## 2022-12-26 DIAGNOSIS — R600 Localized edema: Secondary | ICD-10-CM | POA: Diagnosis not present

## 2022-12-26 DIAGNOSIS — Z96651 Presence of right artificial knee joint: Secondary | ICD-10-CM | POA: Diagnosis not present

## 2022-12-26 NOTE — Progress Notes (Signed)
The patient is now over 4 years status post a right total knee arthroplasty.  He has chronic lymphedema in his legs.  He said he is not doing okay overall.  He will turn 24 in September.  He says he is fatigued quite a bit.  He gets his regular medical care from the [place] system.  His right operative knee is stable.  There is no significant pain over the pes bursa today.  He has lymphedema hose on both legs.  He has good range of motion of that right knee.  He does state the gabapentin is helping quite a bit.  He has seen a spine specialist recently as well who recommended either injections or surgery versus just exercising Tylenol.  He understands that weight loss would help him the most in terms of core weight and core strengthening which would help his back.  This point follow-up for his knee is as needed.  All questions and concerns were addressed and answered.

## 2023-03-14 ENCOUNTER — Other Ambulatory Visit: Payer: Self-pay | Admitting: Physician Assistant

## 2023-04-03 ENCOUNTER — Other Ambulatory Visit (INDEPENDENT_AMBULATORY_CARE_PROVIDER_SITE_OTHER): Payer: No Typology Code available for payment source

## 2023-04-03 ENCOUNTER — Ambulatory Visit (INDEPENDENT_AMBULATORY_CARE_PROVIDER_SITE_OTHER): Payer: No Typology Code available for payment source | Admitting: Physician Assistant

## 2023-04-03 DIAGNOSIS — Z96651 Presence of right artificial knee joint: Secondary | ICD-10-CM

## 2023-04-03 DIAGNOSIS — M7051 Other bursitis of knee, right knee: Secondary | ICD-10-CM | POA: Diagnosis not present

## 2023-04-03 DIAGNOSIS — M7050 Other bursitis of knee, unspecified knee: Secondary | ICD-10-CM

## 2023-04-03 MED ORDER — LIDOCAINE HCL 1 % IJ SOLN
1.0000 mL | INTRAMUSCULAR | Status: AC | PRN
  Administered 2023-04-03: 1 mL

## 2023-04-03 MED ORDER — METHYLPREDNISOLONE ACETATE 40 MG/ML IJ SUSP
40.0000 mg | INTRAMUSCULAR | Status: AC | PRN
  Administered 2023-04-03: 40 mg via INTRAMUSCULAR
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
## 2023-06-19 ENCOUNTER — Other Ambulatory Visit: Payer: Self-pay | Admitting: Physician Assistant

## 2023-07-10 ENCOUNTER — Ambulatory Visit (INDEPENDENT_AMBULATORY_CARE_PROVIDER_SITE_OTHER): Payer: No Typology Code available for payment source | Admitting: Physician Assistant

## 2023-07-10 DIAGNOSIS — M7050 Other bursitis of knee, unspecified knee: Secondary | ICD-10-CM

## 2023-07-10 NOTE — Progress Notes (Signed)
HPI: [name] comes in today for his right knee.  And history of right total knee arthroplasty 11/16/2018.  He states the injection on 04/03/2019 for the pes anserinus gave him good relief.  The pains came back.  He is dealing with his considerable amount of swelling both lower legs.  He is on a diuretic he says helps.  Physical exam: Right knee tenderness over the pes anserinus area.  Bilateral lower extremity swelling right greater than left.  Impression: Status post right total knee arthroplasty Right knee pes anserinus bursitis Lower extremity edema bilateral  Plan: Discussed with him a repeat pes anserinus injection he defers.  Did speak with him if he had seen vascular surgery due to the edema in both lower legs.  He is unsure if he is seeing vascular surgery I recommend that he see them and see if they have any ideas as we have tried multiple conservative treatments for his lower extremity edema.  No office charge for today.  Follow-up as needed.

## 2023-09-18 ENCOUNTER — Other Ambulatory Visit: Payer: Self-pay | Admitting: Physician Assistant

## 2023-11-15 ENCOUNTER — Other Ambulatory Visit: Payer: Self-pay | Admitting: Physician Assistant

## 2023-11-29 ENCOUNTER — Ambulatory Visit: Admitting: Physician Assistant

## 2023-11-30 ENCOUNTER — Encounter: Payer: Self-pay | Admitting: Physician Assistant

## 2023-11-30 ENCOUNTER — Ambulatory Visit: Admitting: Physician Assistant

## 2023-11-30 DIAGNOSIS — M7051 Other bursitis of knee, right knee: Secondary | ICD-10-CM

## 2023-11-30 MED ORDER — METHYLPREDNISOLONE ACETATE 40 MG/ML IJ SUSP
40.0000 mg | INTRAMUSCULAR | Status: AC | PRN
  Administered 2023-11-30: 40 mg via INTRAMUSCULAR

## 2023-11-30 MED ORDER — LIDOCAINE HCL 1 % IJ SOLN
1.0000 mL | INTRAMUSCULAR | Status: AC | PRN
  Administered 2023-11-30: 1 mL

## 2023-11-30 NOTE — Progress Notes (Signed)
   Procedure Note  Patient: Jeremaih Klima             Date of Birth: Dec 23, 1945           MRN: [id number]             Visit Date: 11/30/2023 HPI: Timmy Forbes returns today due to pain in the anterior medial aspect of his knee.  States that the last pes anserinus injection on 04/03/2023 gave him good relief.  Still having some pain in this area.  No falls or injuries.  Denies any fevers or chills.  General: Well-developed well-nourished male no acute distress.  Ambulates without any assistive device. Bilateral lower extremities: Chronic venous insufficiency and lower extremity edema.  Right knee full extension flexion to approximately 90 degrees.  Tenderness over the anterior medial aspect of the knee consistent with pes anserinus bursitis.  Otherwise no abnormal warmth erythema right knee.  Surgical incision is well-healed. Procedures: Visit Diagnoses:  1. Pes anserinus bursitis of right knee     Trigger Point Inj  Date/Time: 11/30/2023 1:53 PM  Performed by: Bronson Canny, PA-C Authorized by: Bronson Canny, PA-C   Total # of Trigger Points:  1 Location: lower extremity   Needle Size:  25 G Approach:  Medial Medications #1:  1 mL lidocaine  1 %; 40 mg methylPREDNISolone  acetate 40 MG/ML    Plan: He will follow-up with us  as needed.  Questions were encouraged and answered at length.

## 2024-03-01 ENCOUNTER — Other Ambulatory Visit: Payer: Self-pay | Admitting: Physician Assistant

## 2024-04-15 ENCOUNTER — Encounter: Payer: Self-pay | Admitting: Radiology

## 2024-05-28 ENCOUNTER — Other Ambulatory Visit: Payer: Self-pay | Admitting: Physician Assistant

## 2024-07-22 ENCOUNTER — Ambulatory Visit: Admitting: Physician Assistant
